# Patient Record
Sex: Male | Born: 1995 | Race: White | Hispanic: No | Marital: Single | Smoking: Never smoker
Health system: Southern US, Community
[De-identification: ages and names within clinical notes are randomized; demographics above are authoritative.]

## PROBLEM LIST (undated history)

## (undated) DIAGNOSIS — N35919 Unspecified urethral stricture, male, unspecified site: Secondary | ICD-10-CM

## (undated) DIAGNOSIS — D229 Melanocytic nevi, unspecified: Secondary | ICD-10-CM

## (undated) DIAGNOSIS — S52509A Unspecified fracture of the lower end of unspecified radius, initial encounter for closed fracture: Secondary | ICD-10-CM

## (undated) DIAGNOSIS — N3090 Cystitis, unspecified without hematuria: Secondary | ICD-10-CM

## (undated) DIAGNOSIS — N390 Urinary tract infection, site not specified: Secondary | ICD-10-CM

## (undated) DIAGNOSIS — T7840XA Allergy, unspecified, initial encounter: Secondary | ICD-10-CM

## (undated) DIAGNOSIS — Q760 Spina bifida occulta: Secondary | ICD-10-CM

## (undated) HISTORY — DX: Spina bifida occulta: Q76.0

## (undated) HISTORY — DX: Urinary tract infection, site not specified: N39.0

## (undated) HISTORY — DX: Melanocytic nevi, unspecified: D22.9

## (undated) HISTORY — DX: Unspecified urethral stricture, male, unspecified site: N35.919

## (undated) HISTORY — DX: Cystitis, unspecified without hematuria: N30.90

## (undated) HISTORY — DX: Unspecified fracture of the lower end of unspecified radius, initial encounter for closed fracture: S52.509A

## (undated) HISTORY — DX: Allergy, unspecified, initial encounter: T78.40XA

---
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
## 2015-03-20 ENCOUNTER — Encounter: Payer: Self-pay | Admitting: *Deleted

## 2015-03-20 ENCOUNTER — Ambulatory Visit (INDEPENDENT_AMBULATORY_CARE_PROVIDER_SITE_OTHER): Payer: 59 | Admitting: Family Medicine

## 2015-03-20 ENCOUNTER — Encounter: Payer: Self-pay | Admitting: Family Medicine

## 2015-03-20 VITALS — BP 118/71 | HR 103 | Temp 98.9°F | Ht 68.92 in | Wt 174.0 lb

## 2015-03-20 DIAGNOSIS — J019 Acute sinusitis, unspecified: Secondary | ICD-10-CM | POA: Diagnosis not present

## 2015-03-20 DIAGNOSIS — J301 Allergic rhinitis due to pollen: Secondary | ICD-10-CM

## 2015-03-20 DIAGNOSIS — J029 Acute pharyngitis, unspecified: Secondary | ICD-10-CM | POA: Diagnosis not present

## 2015-03-20 LAB — POCT INFLUENZA A/B
Influenza A, POC: NEGATIVE
Influenza B, POC: NEGATIVE

## 2015-03-20 LAB — POCT RAPID STREP A (OFFICE): RAPID STREP A SCREEN: NEGATIVE

## 2015-03-20 MED ORDER — AMOXICILLIN-POT CLAVULANATE 875-125 MG PO TABS
1.0000 | ORAL_TABLET | Freq: Two times a day (BID) | ORAL | Status: DC
Start: 2015-03-20 — End: 2015-05-04

## 2015-03-20 NOTE — Patient Instructions (Signed)
Use nasal saline frequently through the day Use Mucinex maximum strength 1 twice daily for cough and congestion with a large glass of water and this is the plain blue and white Mucinex Use nasal steroid like Rhinocort, Nasacort, or Nasonex. Use this regularly at nighttime Take Tylenol as needed for aches pains and fever Drink plenty of fluids Take antibiotic twice daily with food

## 2015-03-20 NOTE — Addendum Note (Signed)
Addended by: Selmer Dominion on: 03/20/2015 04:02 PM   Modules accepted: Orders

## 2015-03-20 NOTE — Progress Notes (Signed)
Subjective:    Patient ID: Dean Nielsen, male    DOB: 20-Apr-1996, 19 y.o.   MRN: [id number]  HPI Patient here today for sinus, congestion and sore throat that started about 3 days ago. The patient has had headache congestion and sinus pressure and sore throat. He is also has slight cough. He did have a headache on 2 occasions. He has not seen any color to the sputum.      Patient Active Problem List   Diagnosis Date Noted  . Chronic constipation 09/18/2012   Outpatient Encounter Prescriptions as of 03/20/2015  Medication Sig  . fluticasone (FLONASE) 50 MCG/ACT nasal spray Place 2 sprays into both nostrils daily. (Patient not taking: Reported on 03/20/2015)  . [DISCONTINUED] amoxicillin (AMOXIL) 500 MG capsule Take 1 capsule (500 mg total) by mouth 3 (three) times daily.  . [DISCONTINUED] cetirizine (ZYRTEC) 10 MG tablet Take 10 mg by mouth daily.  . [DISCONTINUED] fexofenadine-pseudoephedrine (ALLEGRA-D) 60-120 MG per tablet Take 1 tablet by mouth 2 (two) times daily.   No facility-administered encounter medications on file as of 03/20/2015.      Review of Systems  Constitutional: Negative.   HENT: Positive for congestion, postnasal drip, sinus pressure and sore throat.   Eyes: Negative.   Respiratory: Positive for cough (slight ).   Cardiovascular: Negative.   Gastrointestinal: Negative.   Endocrine: Negative.   Genitourinary: Negative.   Musculoskeletal: Negative.   Skin: Negative.   Allergic/Immunologic: Negative.   Neurological: Negative.   Hematological: Negative.   Psychiatric/Behavioral: Negative.        Objective:   Physical Exam  Constitutional: He is oriented to person, place, and time. He appears well-developed and well-nourished. No distress.  HENT:  Head: Normocephalic and atraumatic.  Right Ear: External ear normal.  Left Ear: External ear normal.  Mouth/Throat: No oropharyngeal exudate.  Sinuses were not tender to palpation. The throat is  slightly red posteriorly Nasal congestion bilaterally with turbinate swelling  Eyes: Conjunctivae and EOM are normal. Pupils are equal, round, and reactive to light. Right eye exhibits no discharge. Left eye exhibits no discharge. No scleral icterus.  Neck: Normal range of motion. Neck supple. No thyromegaly present.  Small left anterior cervical adenopathy nontender  Cardiovascular: Normal rate, regular rhythm and normal heart sounds.   No murmur heard. Pulmonary/Chest: Effort normal and breath sounds normal. No respiratory distress. He has no wheezes. He has no rales. He exhibits no tenderness.  The chest is clear anteriorly and posteriorly  Abdominal: He exhibits no mass.  Musculoskeletal: Normal range of motion. He exhibits no edema.  Lymphadenopathy:    He has no cervical adenopathy.  Neurological: He is alert and oriented to person, place, and time.  Skin: Skin is warm and dry. No rash noted.  Psychiatric: He has a normal mood and affect. His behavior is normal. Judgment and thought content normal.  Nursing note and vitals reviewed.   BP 118/71 mmHg  Pulse 103  Temp(Src) 98.9 F (37.2 C) (Oral)  Ht 5' 8.92" (1.751 m)  Wt 174 lb (78.926 kg)  BMI 25.74 kg/m2       Assessment & Plan:  1. Sore throat -Take antibiotic as directed and take Tylenol as needed for aches pains and fever - POCT rapid strep A - Culture, Group A Strep  2. Acute rhinosinusitis -Use nasal saline frequently through the day  3. Non-seasonal allergic rhinitis due to pollen -Use nasal steroid as directed regularly nightly  Meds ordered this encounter  Medications  . amoxicillin-clavulanate (AUGMENTIN) 875-125 MG tablet    Sig: Take 1 tablet by mouth 2 (two) times daily.    Dispense:  20 tablet    Refill:  0   Patient Instructions  Use nasal saline frequently through the day Use Mucinex maximum strength 1 twice daily for cough and congestion with a large glass of water and this is the plain blue  and white Mucinex Use nasal steroid like Rhinocort, Nasacort, or Nasonex. Use this regularly at nighttime Take Tylenol as needed for aches pains and fever Drink plenty of fluids Take antibiotic twice daily with food   Arrie Senate MD

## 2015-03-23 LAB — CULTURE, GROUP A STREP: STREP A CULTURE: NEGATIVE

## 2015-05-04 ENCOUNTER — Ambulatory Visit (INDEPENDENT_AMBULATORY_CARE_PROVIDER_SITE_OTHER): Payer: 59 | Admitting: Family Medicine

## 2015-05-04 ENCOUNTER — Encounter: Payer: Self-pay | Admitting: Family Medicine

## 2015-05-04 VITALS — BP 122/69 | HR 79 | Temp 97.5°F | Ht 68.93 in | Wt 176.0 lb

## 2015-05-04 DIAGNOSIS — L209 Atopic dermatitis, unspecified: Secondary | ICD-10-CM

## 2015-05-04 NOTE — Progress Notes (Signed)
   Subjective:    Patient ID: Dean Nielsen, male    DOB: 01-30-1996, 19 y.o.   MRN: [id number]  HPI Patient here tonight for a rash / hives on his ankles. This was first noticed after applying a cortisone cream to the area for atopic dermatitis. He is also been using Saran wrap over this. His mom is with him says the area is less thick and may be actually improved but there appears to be some spreading dermatitis around the ankle. He is been using Tide detergent and scented fabric softener sheets.      Patient Active Problem List   Diagnosis Date Noted  . Chronic constipation 09/18/2012   Outpatient Encounter Prescriptions as of 05/04/2015  Medication Sig  . fluticasone (FLONASE) 50 MCG/ACT nasal spray Place 2 sprays into both nostrils daily.  . [DISCONTINUED] amoxicillin-clavulanate (AUGMENTIN) 875-125 MG tablet Take 1 tablet by mouth 2 (two) times daily.   No facility-administered encounter medications on file as of 05/04/2015.      Review of Systems  Constitutional: Negative.   HENT: Negative.   Eyes: Negative.   Respiratory: Negative.   Cardiovascular: Negative.   Gastrointestinal: Negative.   Endocrine: Negative.   Genitourinary: Negative.   Musculoskeletal: Negative.   Skin: Positive for rash (ankles).  Allergic/Immunologic: Negative.   Neurological: Negative.   Hematological: Negative.   Psychiatric/Behavioral: Negative.        Objective:   Physical Exam BP 122/69 mmHg  Pulse 79  Temp(Src) 97.5 F (36.4 C) (Oral)  Ht 5' 8.93" (1.751 m)  Wt 176 lb (79.833 kg)  BMI 26.04 kg/m2 There is a dry scaly slightly thickened area around the top of the right foot and ankle. There is no rubor or redness. There is some areas of slight redness for referral to the main rash. Scrapings were done and a KOH will be looked at       Assessment & Plan:  1. Atopic dermatitis -Continue to use triamcinolone cream and mix this with Eucerin or Moisturel and apply this  twice daily to the affected area -Change to a milder detergent and use scent free fabric softeners and continue to use a non-scented mild soap like Dove or [place]  Patient Instructions  Leave off the Saran wrap Continue to apply the triamcinolone cream mixed with an emollient like Eucerin or Moisturel Rewash clothing using a mild detergent like all dermatology approved and use a scent free fabric softener     Arrie Senate MD

## 2015-05-04 NOTE — Patient Instructions (Signed)
Leave off the Saran wrap Continue to apply the triamcinolone cream mixed with an emollient like Eucerin or Moisturel Rewash clothing using a mild detergent like all dermatology approved and use a scent free fabric softener

## 2015-06-19 DIAGNOSIS — M4004 Postural kyphosis, thoracic region: Secondary | ICD-10-CM | POA: Diagnosis not present

## 2015-06-19 DIAGNOSIS — S335XXA Sprain of ligaments of lumbar spine, initial encounter: Secondary | ICD-10-CM | POA: Diagnosis not present

## 2015-06-19 DIAGNOSIS — S134XXA Sprain of ligaments of cervical spine, initial encounter: Secondary | ICD-10-CM | POA: Diagnosis not present

## 2015-06-19 DIAGNOSIS — S233XXA Sprain of ligaments of thoracic spine, initial encounter: Secondary | ICD-10-CM | POA: Diagnosis not present

## 2015-07-30 ENCOUNTER — Encounter: Payer: Self-pay | Admitting: Family Medicine

## 2015-07-30 ENCOUNTER — Ambulatory Visit (INDEPENDENT_AMBULATORY_CARE_PROVIDER_SITE_OTHER): Payer: 59 | Admitting: Family Medicine

## 2015-07-30 ENCOUNTER — Ambulatory Visit (INDEPENDENT_AMBULATORY_CARE_PROVIDER_SITE_OTHER): Payer: 59

## 2015-07-30 ENCOUNTER — Other Ambulatory Visit: Payer: Self-pay | Admitting: Family Medicine

## 2015-07-30 VITALS — BP 121/58 | HR 78 | Temp 98.4°F | Ht 68.0 in | Wt 178.0 lb

## 2015-07-30 DIAGNOSIS — R52 Pain, unspecified: Secondary | ICD-10-CM

## 2015-07-30 DIAGNOSIS — S60122A Contusion of left index finger with damage to nail, initial encounter: Secondary | ICD-10-CM

## 2015-07-30 DIAGNOSIS — S6000XA Contusion of unspecified finger without damage to nail, initial encounter: Secondary | ICD-10-CM

## 2015-07-30 DIAGNOSIS — S6010XA Contusion of unspecified finger with damage to nail, initial encounter: Secondary | ICD-10-CM

## 2015-07-30 NOTE — Progress Notes (Signed)
   Subjective:    Patient ID: Dean Nielsen, male    DOB: May 15, 1996, 20 y.o.   MRN: [id number]  HPI Patient mashed left index finger.   Review of Systems  Constitutional: Negative.   HENT: Negative.   Eyes: Negative.   Respiratory: Negative.   Cardiovascular: Negative.   Gastrointestinal: Negative.   Endocrine: Negative.   Genitourinary: Negative.   Musculoskeletal:       Smashed left index finger in car   Skin: Negative.   Allergic/Immunologic: Negative.   Neurological: Negative.   Hematological: Negative.   Psychiatric/Behavioral: Negative.           Patient Active Problem List   Diagnosis Date Noted  . Chronic constipation 09/18/2012   Outpatient Encounter Prescriptions as of 07/30/2015  Medication Sig  . [DISCONTINUED] fluticasone (FLONASE) 50 MCG/ACT nasal spray Place 2 sprays into both nostrils daily.   No facility-administered encounter medications on file as of 07/30/2015.       Objective:   Physical Exam        Assessment & Plan:

## 2015-07-30 NOTE — Progress Notes (Signed)
   Subjective:    Patient ID: Dean Nielsen, male    DOB: 1995/09/19, 20 y.o.   MRN: [id number]  HPI Patient slammed his left index finger in a car door this morning and is having a lot of pain in the finger and he has a subungual hematoma and the nail of that finger   Review of Systems Painful swollen finger with blood clot beneath nail    Objective:   Physical Exam  BP 121/58 mmHg  Pulse 78  Temp(Src) 98.4 F (36.9 C) (Oral)  Ht 5\' 8"  (1.727 m)  Wt 178 lb (80.74 kg)  BMI 27.07 kg/m2   [facility] reading (PRIMARY) by  Dr.Moore-x-ray appears normal Pressure was released from beneath the nail with a hot wick and patient immediately felt better and tolerated the procedure well.                                         Assessment & Plan:  1. Finger contusion, initial encounter -The x-ray appears negative and the patient he use some ice off and on for the next 48 hours to reduce the swelling  2. Subungual hematoma of digit of hand, initial encounter -A hot PEN was used to release the pressure from beneath the nail and the patient tolerated the procedure well and felt better.   Patient Instructions  Use ice on the finger for the next 48 hours and keep gauze to sort of blood from the subungual hematoma   Arrie Senate MD

## 2015-07-30 NOTE — Patient Instructions (Signed)
Use ice on the finger for the next 48 hours and keep gauze to sort of blood from the subungual hematoma

## 2015-09-02 DIAGNOSIS — D229 Melanocytic nevi, unspecified: Secondary | ICD-10-CM

## 2015-09-02 DIAGNOSIS — L2081 Atopic neurodermatitis: Secondary | ICD-10-CM | POA: Diagnosis not present

## 2015-09-02 DIAGNOSIS — D485 Neoplasm of uncertain behavior of skin: Secondary | ICD-10-CM | POA: Diagnosis not present

## 2015-09-02 HISTORY — DX: Melanocytic nevi, unspecified: D22.9

## 2015-09-09 ENCOUNTER — Encounter (INDEPENDENT_AMBULATORY_CARE_PROVIDER_SITE_OTHER): Payer: Self-pay

## 2016-02-05 ENCOUNTER — Ambulatory Visit (INDEPENDENT_AMBULATORY_CARE_PROVIDER_SITE_OTHER): Payer: 59 | Admitting: Family Medicine

## 2016-02-05 ENCOUNTER — Encounter: Payer: Self-pay | Admitting: Family Medicine

## 2016-02-05 VITALS — BP 109/74 | HR 78 | Temp 97.9°F | Ht 68.0 in | Wt 186.0 lb

## 2016-02-05 DIAGNOSIS — N62 Hypertrophy of breast: Secondary | ICD-10-CM | POA: Diagnosis not present

## 2016-02-05 DIAGNOSIS — R0789 Other chest pain: Secondary | ICD-10-CM | POA: Diagnosis not present

## 2016-02-05 DIAGNOSIS — F419 Anxiety disorder, unspecified: Secondary | ICD-10-CM

## 2016-02-05 NOTE — Progress Notes (Signed)
   HPI  Patient presents today here with left-sided chest/underarm pain.  Patient's lines it's been going on for about 3 months, it comes and goes. At times it bothers him every day for a week and then at times it goes away for a few weeks. He's been doing similar upper body exercises for for 5 years. He denies any shortness of breath, palpitations, swelling, shortness of breath, or headaches associated with the pain. It is not exertional. He cannot identify any aggravating or alleviating factors. It comes on it lasts for over an hour and then goes away.  It's described as a throbbing left-sided underarms/left lateral chest pain. It is nonradiating.  Timing seems to occur mostly at work in the middle of the day when he is at his highest demand. 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   PMH: Smoking status noted ROS: Per HPI  Objective: BP 109/74   Pulse 78   Temp 97.9 F (36.6 C) (Oral)   Ht 5\' 8"  (1.727 m)   Wt 186 lb (84.4 kg)   BMI 28.28 kg/m  Gen: NAD, alert, cooperative with exam HEENT: NCAT CV: RRR, good S1/S2, no murmur Chest wall: small to moderate size symmetric male breast, tenderness to palpation of the lateral edge of the pec major and lateral edge of left breast. Resp: CTABL, no wheezes, non-labored Abd: SNTND, BS present, no guarding or organomegaly Ext: No edema, warm Neuro: Alert and oriented, No gross deficits  Depression screen [facility] 2/9 02/05/2016 05/04/2015 03/20/2015 10/31/2014 10/15/2014  Decreased Interest 1 0 0 0 0  Down, Depressed, Hopeless 1 0 0 0 0  PHQ - 2 Score 2 0 0 0 0  Altered sleeping 2 - - - -  Tired, decreased energy 2 - - - -  Change in appetite 1 - - - -  Feeling bad or failure about yourself  0 - - - -  Trouble concentrating  0 - - - -  Moving slowly or fidgety/restless 0 - - - -  Suicidal thoughts 0 - - - -  PHQ-9 Score 7 - - - -  Difficult doing work/chores Somewhat difficult - - - -   GAD 7 : Generalized Anxiety Score 02/05/2016  Nervous, Anxious, on Edge 1  Control/stop worrying 3  Worry too much - different things 2  Trouble relaxing 2  Restless 1  Easily annoyed or irritable 2  Afraid - awful might happen 3  Total GAD 7 Score 14  Anxiety Difficulty Somewhat difficult     Assessment and plan:  # Chest wall pain Musculoskeletal pain of the pectoralis major versus gynecomastia related pain Treat conservatively with NSAIDs scheduled 7-10 days, ice, and decreasing intensity of upper body exercises for 2 weeks. Also possibly contribute to by anxiety.   # Anxiety Possible underlying generalized anxiety disorder Discussed coping mechanisms, possibility of CBT (told about [email or web address]), and possibility of medications.  Left it open ended and let him know that we can treat more aggressively if need be.     Laroy Apple, MD [facility] 02/05/2016, 6:34 PM

## 2016-02-05 NOTE — Patient Instructions (Signed)
Great to meet you!  Lets start with aleve 1-2 pills twice daily for 10 days Try Ice 10-15 minutes 2-3 times a day when it is bothering you.   Take it easy on your upper body exercises for the next few weeks.   Come back if it is getting worse or it does not seem to be getting better in a few weeks.

## 2016-02-16 ENCOUNTER — Telehealth: Payer: Self-pay | Admitting: Family Medicine

## 2016-02-16 MED ORDER — LORAZEPAM 0.5 MG PO TABS: 0.2500 mg | ORAL_TABLET | Freq: Three times a day (TID) | ORAL | 0 refills | Status: DC | PRN

## 2016-02-16 NOTE — Telephone Encounter (Signed)
Discussed recent panic attacks with his mother.   He has nearly gone to the ED 2 times recently.    Small amount of ativan given   Will plan to start SSRI for controller  He will follow up within 1-2 weeks.   Laroy Apple, MD [facility] 02/16/2016, 12:03 PM

## 2016-03-24 ENCOUNTER — Ambulatory Visit (INDEPENDENT_AMBULATORY_CARE_PROVIDER_SITE_OTHER): Payer: 59 | Admitting: Family Medicine

## 2016-03-24 ENCOUNTER — Encounter: Payer: Self-pay | Admitting: Family Medicine

## 2016-03-24 VITALS — BP 116/73 | HR 87 | Temp 98.1°F | Ht 68.0 in | Wt 169.0 lb

## 2016-03-24 DIAGNOSIS — F419 Anxiety disorder, unspecified: Secondary | ICD-10-CM | POA: Diagnosis not present

## 2016-03-24 MED ORDER — BUSPIRONE HCL 15 MG PO TABS: 15.0000 mg | ORAL_TABLET | Freq: Two times a day (BID) | ORAL | 2 refills | Status: DC

## 2016-03-24 NOTE — Progress Notes (Signed)
   Subjective:    Patient ID: Dean Nielsen, male    DOB: Apr 15, 1996, 20 y.o.   MRN: [id number]  HPI Patient here today to discuss depression and anxiety.The patient feels like he has a lot of stress on him. He is working and is going to 2 different schools to get his requirement so he can move on to [employer] for his training in Research officer, trade union. Or technology. He denies any physical complaints other than just fatigue. He denies any chest pain shortness of breath GI symptoms or voiding symptoms. He does admit to breaking up with his girlfriend of several years couple months ago and that this has set him back quite a bit. He says he is recovering somewhat from that but has not regained his physical endurance and he thinks that that is why he lost the weight that he is lost.     Patient Active Problem List   Diagnosis Date Noted  . Chronic constipation 09/18/2012   Outpatient Encounter Prescriptions as of 03/24/2016  Medication Sig  . [DISCONTINUED] LORazepam (ATIVAN) 0.5 MG tablet Take 0.5-1 tablets (0.25-0.5 mg total) by mouth every 8 (eight) hours as needed for anxiety. (Patient not taking: Reported on 03/24/2016)   No facility-administered encounter medications on file as of 03/24/2016.       Review of Systems  Constitutional: Negative.   HENT: Negative.   Eyes: Negative.   Respiratory: Negative.   Cardiovascular: Negative.   Gastrointestinal: Negative.   Endocrine: Negative.   Genitourinary: Negative.   Musculoskeletal: Negative.   Skin: Negative.   Allergic/Immunologic: Negative.   Neurological: Negative.   Hematological: Negative.   Psychiatric/Behavioral: The patient is nervous/anxious.        Objective:   Physical Exam  Constitutional: He is oriented to person, place, and time. He appears well-developed and well-nourished. No distress.  HENT:  Head: Normocephalic and atraumatic.  Eyes: Conjunctivae and EOM are normal. Pupils are equal, round, and reactive to  light. Right eye exhibits no discharge. Left eye exhibits no discharge. No scleral icterus.  Neck: Normal range of motion.  Musculoskeletal: Normal range of motion.  Neurological: He is alert and oriented to person, place, and time.  Skin: No rash noted.  Psychiatric: He has a normal mood and affect. His behavior is normal. Judgment and thought content normal.    BP 116/73 (BP Location: Left Arm)   Pulse 87   Temp 98.1 F (36.7 C) (Oral)   Ht 5\' 8"  (1.727 m)   Wt 169 lb (76.7 kg)   BMI 25.70 kg/m        Assessment & Plan:  1. Anxiety -Take BuSpar 15 mg twice daily on a regular basis -Avoid caffeine after 3:00 in the afternoon -Increase exercise -Keep your faith and know that you're doing the right thing for your future  Patient Instructions  Call us back in 1-2 weeks and let us know how you are doing  No caffeine after 3 pm Take medication regularly twice a day Continue to exercise regularly and rebuild your endurance Drink plenty of water and stay well hydrated     Arrie Senate MD

## 2016-03-24 NOTE — Patient Instructions (Addendum)
Call us back in 1-2 weeks and let us know how you are doing  No caffeine after 3 pm Take medication regularly twice a day Continue to exercise regularly and rebuild your endurance Drink plenty of water and stay well hydrated

## 2016-04-04 ENCOUNTER — Encounter: Payer: Self-pay | Admitting: Family Medicine

## 2016-04-04 ENCOUNTER — Ambulatory Visit (INDEPENDENT_AMBULATORY_CARE_PROVIDER_SITE_OTHER): Payer: 59 | Admitting: Family Medicine

## 2016-04-04 VITALS — BP 112/74 | HR 68 | Temp 98.1°F | Ht 68.0 in | Wt 167.5 lb

## 2016-04-04 DIAGNOSIS — H8110 Benign paroxysmal vertigo, unspecified ear: Secondary | ICD-10-CM | POA: Diagnosis not present

## 2016-04-04 NOTE — Patient Instructions (Signed)
Great to see you!   Benign Positional Vertigo Vertigo is the feeling that you or your surroundings are moving when they are not. Benign positional vertigo is the most common form of vertigo. The cause of this condition is not serious (is benign). This condition is triggered by certain movements and positions (is positional). This condition can be dangerous if it occurs while you are doing something that could endanger you or others, such as driving.  CAUSES In many cases, the cause of this condition is not known. It may be caused by a disturbance in an area of the inner ear that helps your brain to sense movement and balance. This disturbance can be caused by a viral infection (labyrinthitis), head injury, or repetitive motion. RISK FACTORS This condition is more likely to develop in:  Women.  People who are 16 years of age or older. SYMPTOMS Symptoms of this condition usually happen when you move your head or your eyes in different directions. Symptoms may start suddenly, and they usually last for less than a minute. Symptoms may include:  Loss of balance and falling.  Feeling like you are spinning or moving.  Feeling like your surroundings are spinning or moving.  Nausea and vomiting.  Blurred vision.  Dizziness.  Involuntary eye movement (nystagmus). Symptoms can be mild and cause only slight annoyance, or they can be severe and interfere with daily life. Episodes of benign positional vertigo may return (recur) over time, and they may be triggered by certain movements. Symptoms may improve over time. DIAGNOSIS This condition is usually diagnosed by medical history and a physical exam of the head, neck, and ears. You may be referred to a health care provider who specializes in ear, nose, and throat (ENT) problems (otolaryngologist) or a provider who specializes in disorders of the nervous system (neurologist). You may have additional testing, including:  MRI.  A CT scan.  Eye  movement tests. Your health care provider may ask you to change positions quickly while he or she watches you for symptoms of benign positional vertigo, such as nystagmus. Eye movement may be tested with an electronystagmogram (ENG), caloric stimulation, the Dix-Hallpike test, or the roll test.  An electroencephalogram (EEG). This records electrical activity in your brain.  Hearing tests. TREATMENT Usually, your health care provider will treat this by moving your head in specific positions to adjust your inner ear back to normal. Surgery may be needed in severe cases, but this is rare. In some cases, benign positional vertigo may resolve on its own in 2-4 weeks. HOME CARE INSTRUCTIONS Safety  Move slowly.Avoid sudden body or head movements.  Avoid driving.  Avoid operating heavy machinery.  Avoid doing any tasks that would be dangerous to you or others if a vertigo episode would occur.  If you have trouble walking or keeping your balance, try using a cane for stability. If you feel dizzy or unstable, sit down right away.  Return to your normal activities as told by your health care provider. Ask your health care provider what activities are safe for you. General Instructions  Take over-the-counter and prescription medicines only as told by your health care provider.  Avoid certain positions or movements as told by your health care provider.  Drink enough fluid to keep your urine clear or pale yellow.  Keep all follow-up visits as told by your health care provider. This is important. SEEK MEDICAL CARE IF:  You have a fever.  Your condition gets worse or you develop new  symptoms.  Your family or friends notice any behavioral changes.  Your nausea or vomiting gets worse.  You have numbness or a "pins and needles" sensation. SEEK IMMEDIATE MEDICAL CARE IF:  You have difficulty speaking or moving.  You are always dizzy.  You faint.  You develop severe headaches.  You  have weakness in your legs or arms.  You have changes in your hearing or vision.  You develop a stiff neck.  You develop sensitivity to light.   This information is not intended to replace advice given to you by your health care provider. Make sure you discuss any questions you have with your health care provider.   Document Released: 02/28/2006 Document Revised: 02/11/2015 Document Reviewed: 09/15/2014 Elsevier Interactive Patient Education [employer].

## 2016-04-04 NOTE — Progress Notes (Signed)
   HPI  Patient presents today with left ear fullness and dizziness.  Patient states over the weekend he was working under a car when he had severe symptoms of room spinning sensation. He states he continued for a few minutes and associated severe he had to hold onto the car to sit up right. It subsided and has largely gone away since that time.  Today he feels uneasy on his feet but has not had any more significant symptoms.  He's had mild ear fullness on the left but no significant ear pain. He otherwise feels well denies shortness of breath, chest pain, or difficulty tolerating foods or fluids  PMH: Smoking status noted ROS: Per HPI  Objective: BP 112/74   Pulse 68   Temp 98.1 F (36.7 C) (Oral)   Ht 5\' 8"  (1.727 m)   Wt 167 lb 8 oz (76 kg)   BMI 25.47 kg/m  Gen: NAD, alert, cooperative with exam HEENT: NCAT, TMs normal bilaterally CV: RRR, good S1/S2, no murmur Resp: CTABL, no wheezes, non-labored Ext: No edema, warm Neuro: Alert and oriented, No gross deficits  Assessment and plan:  # Benign paroxysmal positional vertigo Symptoms resolving, reassurance provided Discussed Epley's maneuver, reviewed YouTube video and given a handout. Discussed the utility of meclizine, he will defer for now. Return to clinic as needed No fluid seen in the ear, doubt any otitis media.   Laroy Apple, MD [facility] 04/04/2016, 7:18 PM

## 2016-04-26 ENCOUNTER — Ambulatory Visit (INDEPENDENT_AMBULATORY_CARE_PROVIDER_SITE_OTHER): Payer: 59 | Admitting: Licensed Clinical Social Worker

## 2016-04-26 ENCOUNTER — Ambulatory Visit: Payer: 59 | Admitting: Licensed Clinical Social Worker

## 2016-04-26 DIAGNOSIS — F321 Major depressive disorder, single episode, moderate: Secondary | ICD-10-CM

## 2016-05-24 ENCOUNTER — Ambulatory Visit (INDEPENDENT_AMBULATORY_CARE_PROVIDER_SITE_OTHER): Payer: 59 | Admitting: Licensed Clinical Social Worker

## 2016-05-24 DIAGNOSIS — F324 Major depressive disorder, single episode, in partial remission: Secondary | ICD-10-CM | POA: Diagnosis not present

## 2016-06-07 ENCOUNTER — Ambulatory Visit (INDEPENDENT_AMBULATORY_CARE_PROVIDER_SITE_OTHER): Payer: 59 | Admitting: Licensed Clinical Social Worker

## 2016-06-07 DIAGNOSIS — F324 Major depressive disorder, single episode, in partial remission: Secondary | ICD-10-CM

## 2016-09-26 ENCOUNTER — Ambulatory Visit (INDEPENDENT_AMBULATORY_CARE_PROVIDER_SITE_OTHER): Payer: 59 | Admitting: Family Medicine

## 2016-09-26 ENCOUNTER — Encounter: Payer: Self-pay | Admitting: Family Medicine

## 2016-09-26 VITALS — BP 110/72 | HR 82 | Temp 98.6°F | Ht 68.0 in | Wt 169.0 lb

## 2016-09-26 DIAGNOSIS — J029 Acute pharyngitis, unspecified: Secondary | ICD-10-CM | POA: Diagnosis not present

## 2016-09-26 DIAGNOSIS — R300 Dysuria: Secondary | ICD-10-CM

## 2016-09-26 LAB — CULTURE, GROUP A STREP

## 2016-09-26 LAB — URINALYSIS, COMPLETE
Bilirubin, UA: NEGATIVE
Glucose, UA: NEGATIVE
Ketones, UA: NEGATIVE
LEUKOCYTES UA: NEGATIVE
Nitrite, UA: NEGATIVE
PH UA: 8.5 — AB (ref 5.0–7.5)
PROTEIN UA: NEGATIVE
RBC, UA: NEGATIVE
Specific Gravity, UA: 1.015 (ref 1.005–1.030)
Urobilinogen, Ur: 0.2 mg/dL (ref 0.2–1.0)

## 2016-09-26 LAB — RAPID STREP SCREEN (MED CTR MEBANE ONLY): STREP GP A AG, IA W/REFLEX: NEGATIVE

## 2016-09-26 LAB — MICROSCOPIC EXAMINATION
BACTERIA UA: NONE SEEN
RENAL EPITHEL UA: NONE SEEN /HPF

## 2016-09-26 LAB — VERITOR FLU A/B WAIVED
Influenza A: NEGATIVE
Influenza B: NEGATIVE

## 2016-09-26 MED ORDER — DOXYCYCLINE HYCLATE 100 MG PO TABS: 100.0000 mg | ORAL_TABLET | Freq: Two times a day (BID) | ORAL | 0 refills | Status: DC

## 2016-09-26 MED ORDER — LORATADINE 10 MG PO TABS: 10.0000 mg | ORAL_TABLET | Freq: Every day | ORAL | 11 refills | Status: DC

## 2016-09-26 NOTE — Progress Notes (Signed)
BP 110/72   Pulse 82   Temp 98.6 F (37 C) (Oral)   Ht 5\' 8"  (1.727 m)   Wt 169 lb (76.7 kg)   BMI 25.70 kg/m    Subjective:    Patient ID: Dean Nielsen, male    DOB: 1995-07-16, 21 y.o.   MRN: [id number]  HPI: Dean Nielsen is a 21 y.o. male presenting on 09/26/2016 for Sore Throat (began last night; using Chloraseptic spray); Cough; and Pain in right shoulder into shoulder blade (began a few weeks ago after working out in gym)   Sore Throat   This is a new problem. The current episode started yesterday. The problem has been unchanged. Neither side of throat is experiencing more pain than the other. There has been no fever. The pain is moderate. Associated symptoms include coughing, shortness of breath and trouble swallowing. Pertinent negatives include no abdominal pain, congestion, diarrhea, ear pain, headaches, neck pain, stridor or vomiting. Treatments tried: chloroseptic spray. The treatment provided mild (Patient states his roommate has had similar symptoms starting yesterday.) relief.  Cough  This is a new problem. The current episode started today. The problem has been unchanged. The cough is non-productive. Associated symptoms include myalgias, a sore throat and shortness of breath. Pertinent negatives include no chills, ear pain, fever, headaches, rash or wheezing. Associated symptoms comments: Became short of breath while eating breakfast today. Feels fatigued today. He has tried nothing for the symptoms. His past medical history is significant for environmental allergies.  Dark Cloudy Urine Patient states he has been having dark cloudy urine in the morning for the past few days. Patient states that urine has a mild smell and believes that bacteria is growing in his urine. Patient has a history of UTIs and bladder surgery in the past. Patient states that his urine becomes clearer throughout the day with drinking water and is worse in the morning. Patient denies pain  and blood in urine.  Relevant past medical, surgical, family and social history reviewed and updated as indicated. Interim medical history since our last visit reviewed. Allergies and medications reviewed and updated.  Review of Systems  Constitutional: Positive for fatigue. Negative for chills and fever.  HENT: Positive for sore throat and trouble swallowing. Negative for congestion, ear pain and sinus pressure.   Eyes: Negative.   Respiratory: Positive for cough and shortness of breath. Negative for choking, chest tightness, wheezing and stridor.   Cardiovascular: Negative.   Gastrointestinal: Negative for abdominal pain, constipation, diarrhea, nausea and vomiting.  Genitourinary: Negative for difficulty urinating, flank pain, frequency and urgency.       Dark Cloudy Urine  Musculoskeletal: Positive for myalgias. Negative for arthralgias, neck pain and neck stiffness.  Skin: Negative for rash.  Allergic/Immunologic: Positive for environmental allergies.  Neurological: Negative for dizziness, weakness, numbness and headaches.    Per HPI unless specifically indicated above     Objective:    BP 110/72   Pulse 82   Temp 98.6 F (37 C) (Oral)   Ht 5\' 8"  (1.727 m)   Wt 169 lb (76.7 kg)   BMI 25.70 kg/m   Wt Readings from Last 3 Encounters:  09/26/16 169 lb (76.7 kg)  04/04/16 167 lb 8 oz (76 kg)  03/24/16 169 lb (76.7 kg)    Physical Exam  Constitutional: He is oriented to person, place, and time. He appears well-developed and well-nourished. No distress.  HENT:  Head: Normocephalic and atraumatic.  Right Ear: External ear normal.  Left Ear: External ear normal.  Mouth/Throat: Posterior oropharyngeal erythema present. No oropharyngeal exudate.    Eyes: Conjunctivae and EOM are normal. Pupils are equal, round, and reactive to light. Right eye exhibits no discharge. Left eye exhibits no discharge.  Neck: Normal range of motion. Neck supple.  Bilateral anterior cervical  adenopathy  Cardiovascular: Normal rate, regular rhythm, normal heart sounds and intact distal pulses.  Exam reveals no gallop and no friction rub.   No murmur heard. Pulmonary/Chest: Effort normal and breath sounds normal. No respiratory distress. He has no wheezes. He has no rales.  Abdominal: Soft. There is no tenderness.  Musculoskeletal: Normal range of motion.  Lymphadenopathy:    He has cervical adenopathy.  Neurological: He is alert and oriented to person, place, and time.  Skin: Skin is warm and dry.  Psychiatric: He has a normal mood and affect. His behavior is normal.    Rapid strep and rapid flu, negative  Urinalysis: 0-5 wbc's, 0-2 RBCs, 0-10 epithelial cells, crystals present, amorphous.    Assessment & Plan:   Problem List Items Addressed This Visit      Other   Sore throat - Primary   Relevant Medications   doxycycline (VIBRA-TABS) 100 MG tablet   loratadine (CLARITIN) 10 MG tablet   Other Relevant Orders   Veritor Flu A/B Waived   Rapid strep screen (not at [facility])   Culture, Group A Strep    Other Visit Diagnoses    Dysuria       Relevant Medications   doxycycline (VIBRA-TABS) 100 MG tablet   Other Relevant Orders   Urinalysis, Complete   Microalbumin / creatinine urine ratio     Patient seen and examined with Nuala Alpha a student. Agree with assessment and plan above. Recommended for patient to see if symptoms cleared up over the next couple days but they did not I doxycycline and old left forearm in case he needs to pick up.   Follow up plan: Return if symptoms worsen or fail to improve.  Counseling provided for all of the vaccine components Orders Placed This Encounter  Procedures  . Veritor Flu A/B Waived  . Rapid strep screen (not at [facility])  . Culture, Group A Strep

## 2016-09-27 LAB — MICROALBUMIN / CREATININE URINE RATIO
Creatinine, Urine: 86.1 mg/dL
Microalb/Creat Ratio: <3.5 mg/g creat (ref 0.0–30.0)
Microalbumin, Urine: <3 ug/mL

## 2016-09-30 LAB — CULTURE, GROUP A STREP

## 2016-10-18 ENCOUNTER — Other Ambulatory Visit: Payer: Self-pay | Admitting: Family

## 2016-10-18 MED ORDER — AMOXICILLIN-POT CLAVULANATE 875-125 MG PO TABS: 1.0000 | ORAL_TABLET | Freq: Two times a day (BID) | ORAL | 0 refills | Status: DC

## 2016-10-18 NOTE — Progress Notes (Signed)
Augmentin Prescription sent to pharmacy,

## 2016-10-24 ENCOUNTER — Ambulatory Visit (INDEPENDENT_AMBULATORY_CARE_PROVIDER_SITE_OTHER): Payer: 59 | Admitting: Pediatrics

## 2016-10-24 ENCOUNTER — Encounter: Payer: Self-pay | Admitting: Pediatrics

## 2016-10-24 VITALS — BP 97/58 | HR 83 | Temp 97.9°F | Ht 68.0 in | Wt 179.0 lb

## 2016-10-24 DIAGNOSIS — Z85820 Personal history of malignant melanoma of skin: Secondary | ICD-10-CM | POA: Diagnosis not present

## 2016-10-24 DIAGNOSIS — G8929 Other chronic pain: Secondary | ICD-10-CM

## 2016-10-24 DIAGNOSIS — R591 Generalized enlarged lymph nodes: Secondary | ICD-10-CM | POA: Diagnosis not present

## 2016-10-24 DIAGNOSIS — Z9889 Other specified postprocedural states: Secondary | ICD-10-CM | POA: Diagnosis not present

## 2016-10-24 DIAGNOSIS — M25511 Pain in right shoulder: Secondary | ICD-10-CM | POA: Diagnosis not present

## 2016-10-24 NOTE — Progress Notes (Signed)
  Subjective:   Patient ID: Dean Nielsen, male    DOB: 03/06/1996, 21 y.o.   MRN: [id number] CC: knot in neck (right side larger than left ) and right shoulder pain (worse (mentioned in the past))  HPI: Dean Nielsen is a 21 y.o. male presenting for knot in neck (right side larger than left ) and right shoulder pain (worse (mentioned in the past))  Noticed the nodes in his neck a few days ago No pain, not bothering him. Not growing or changing, not tender or red No other nodes/lumps that he has noticed No sore throat, no ear pain, no fevers Normal appetite Energy levels have been fine Feeling well  H/o melanoma, was excised.  Follows with dermatology q6 mo now  R shoulder has been bothering him for past 3 mo Started after an injury, lifting weight from behind and up over his head Didn't feel anything pop, but R shoulder hurt following the incident Stopped weight lifting for a month Has been taking NSAIDs Continues to be bothered with the same movement No weakness, no numbness, tingling in arm Never injured this shoulder before Points to top of shoulder, back to shoulder blade when asked where pain is  Relevant past medical, surgical, family and social history reviewed. Allergies and medications reviewed and updated. History  Smoking Status  . Never Smoker  Smokeless Tobacco  . Never Used   ROS: Per HPI   Objective:    BP (!) 97/58 (BP Location: Left Arm)   Pulse 83   Temp 97.9 F (36.6 C) (Oral)   Ht 5\' 8"  (1.727 m)   Wt 179 lb (81.2 kg)   BMI 27.22 kg/m   Wt Readings from Last 3 Encounters:  10/24/16 179 lb (81.2 kg)  09/26/16 169 lb (76.7 kg)  04/04/16 167 lb 8 oz (76 kg)    Gen: NAD, alert, cooperative with exam, NCAT EYES: EOMI, no conjunctival injection, or no icterus ENT:  TMs pink, injected b/l with clear effusion, OP with mild erythema LYMPH: + <1cm cervical LAD b/l, easily mobile, no tenderness CV: NRRR, normal S1/S2, no murmur, distal  pulses 2+ b/l Resp: CTABL, no wheezes, normal WOB Ext: No edema, warm Neuro: Alert and oriented, sensation intact b/l UE MSK: shoulders appear symmetric Shoulder: Inspection reveals no abnormalities, atrophy or asymmetry. no tenderness over AC joint or bicipital groove, some ttp over soft tissue over trapezius ROM is full b/l Pain/weakness with neers R side Rotator cuff strength otherwise normal  Assessment & Plan:  Dean Nielsen was seen today for knot in neck and right shoulder pain.  Diagnoses and all orders for this visit:  Lymphadenopathy Likely due to virus given other exam findings Should improve over next week Will check below Any worsening let me know -     CBC with Differential/Platelet -     Basic Metabolic Panel  Chronic right shoulder pain -     Ambulatory referral to Sports Medicine  H/O melanoma excision Follows regularly with dermatology  Follow up plan: prn Assunta Found, MD [place]

## 2016-10-25 DIAGNOSIS — Z9889 Other specified postprocedural states: Secondary | ICD-10-CM | POA: Insufficient documentation

## 2016-10-25 DIAGNOSIS — Z85820 Personal history of malignant melanoma of skin: Secondary | ICD-10-CM | POA: Insufficient documentation

## 2016-10-26 LAB — BASIC METABOLIC PANEL
BUN/Creatinine Ratio: 13 (ref 9–20)
BUN: 17 mg/dL (ref 6–20)
CALCIUM: 9.5 mg/dL (ref 8.7–10.2)
CHLORIDE: 100 mmol/L (ref 96–106)
CO2: 28 mmol/L (ref 18–29)
Creatinine, Ser: 1.32 mg/dL — ABNORMAL HIGH (ref 0.76–1.27)
GFR calc Af Amer: 89 mL/min/{1.73_m2} (ref >59)
GFR calc non Af Amer: 77 mL/min/{1.73_m2} (ref >59)
GLUCOSE: 80 mg/dL (ref 65–99)
POTASSIUM: 4.4 mmol/L (ref 3.5–5.2)
SODIUM: 142 mmol/L (ref 134–144)

## 2016-10-26 LAB — CBC WITH DIFFERENTIAL/PLATELET
BASOS ABS: 0 10*3/uL (ref 0.0–0.2)
Basos: 0 %
EOS (ABSOLUTE): 0.2 10*3/uL (ref 0.0–0.4)
Eos: 3 %
HEMOGLOBIN: 14.9 g/dL (ref 13.0–17.7)
Hematocrit: 45.4 % (ref 37.5–51.0)
IMMATURE GRANS (ABS): 0 10*3/uL (ref 0.0–0.1)
Immature Granulocytes: 0 %
LYMPHS: 48 %
Lymphocytes Absolute: 3.2 10*3/uL — ABNORMAL HIGH (ref 0.7–3.1)
MCH: 30.4 pg (ref 26.6–33.0)
MCHC: 32.8 g/dL (ref 31.5–35.7)
MCV: 93 fL (ref 79–97)
MONOCYTES: 5 %
Monocytes Absolute: 0.4 10*3/uL (ref 0.1–0.9)
NEUTROS ABS: 3.1 10*3/uL (ref 1.4–7.0)
Neutrophils: 44 %
PLATELETS: 265 10*3/uL (ref 150–379)
RBC: 4.9 x10E6/uL (ref 4.14–5.80)
RDW: 13 % (ref 12.3–15.4)
WBC: 6.9 10*3/uL (ref 3.4–10.8)

## 2016-11-04 ENCOUNTER — Encounter: Payer: Self-pay | Admitting: Family Medicine

## 2016-11-04 ENCOUNTER — Telehealth: Payer: Self-pay | Admitting: *Deleted

## 2016-11-04 ENCOUNTER — Ambulatory Visit (INDEPENDENT_AMBULATORY_CARE_PROVIDER_SITE_OTHER): Payer: 59 | Admitting: Family Medicine

## 2016-11-04 VITALS — BP 120/70 | Ht 70.0 in | Wt 173.0 lb

## 2016-11-04 DIAGNOSIS — N62 Hypertrophy of breast: Secondary | ICD-10-CM

## 2016-11-04 DIAGNOSIS — M25511 Pain in right shoulder: Secondary | ICD-10-CM | POA: Diagnosis not present

## 2016-11-04 DIAGNOSIS — G2589 Other specified extrapyramidal and movement disorders: Secondary | ICD-10-CM

## 2016-11-04 NOTE — Telephone Encounter (Signed)
FYI

## 2016-11-04 NOTE — Progress Notes (Signed)
   Subjective:    Patient ID: Dean Nielsen, male    DOB: 1995/11/07, 21 y.o.   MRN: [id number]  HPI Dean Nielsen is a 21yo male presenting today for right shoulder pain. Notes 69months ago he was lifting weights up from behind his head in a triceps extension when he noted his right shoulder becoming "unbearably uncomfortable." Denies any popping or noting acute onset of pain, just became gradually more uncomfortable. Since then, pain has spread to his right chest and right shoulder blade. Pain is worse in the morning and it is difficult for him to move his shoulder when he first gets up. During the day, pain does not necessarily limit activity. Has not lifted weights for 6weeks. Has been using Ibuprofen and Fish Oil without relief. Works in Engineer, technical sales. Has history of gynecomastia and melanoma. Right hand dominant. Nonsmoker.  Review of Systems No numbness or tingling in his fingers. Does not feel like he's lost strength in his upper extremities. He denies unusual weight change. No fevers, sweats, chills. No lower extremity symptoms of paresthesias or weakness.  PERTINENT  PMH / PSH: I have reviewed the patient's medications, allergies, past medical and surgical history, smoking status.  Pertinent findings that relate to today's visit / issues include: No personal history diabetes mellitus No history of prior neck injury or neck surgery. No history of shoulder surgery. Prior history of melanoma excision Prior mention of gynecomastia in his chart    Objective:   Physical Exam  Constitutional: He appears well-developed and well-nourished. No distress.  Pulmonary/Chest: Effort normal. No respiratory distress.  Musculoskeletal:  Asymmetry noted over posterior shoulders, please see pictures below. ROM symmetric in shoulders bilaterally. Tenderness over right infraspinatus. Mild pain with right empty can. Rotator cuff intact. Muscle strength 5/5 in upper and lower extremities.    Psychiatric: He has a  normal mood and affect. His behavior is normal.      Assessment & Plan:

## 2016-11-04 NOTE — Telephone Encounter (Signed)
Mother here at the office.  Mother states that patient seen sport's medicine today 11/04/16 and was informed that he would need a referral from PCP to have Gynecomastia evaluated

## 2016-11-04 NOTE — Telephone Encounter (Signed)
Talked with Mom & patient on phone Appt scheduled

## 2016-11-04 NOTE — Progress Notes (Signed)
[place] Attending Note: I have seen and examined this patient. I have discussed this patient with the resident and reviewed the assessment and plan as documented above. I agree with the resident's findings and plan. Scapular dyskinesia. This may have started with cervical injury. He appears to have pain and some mild atrophy of the right infraspinatus. Either the left scapula is weighing or the right scapula is unusually flat, either way they are asymmetrical representing a dyskinesis. 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   I think at this point the best route would be to try physical therapy to regain what he is lost. This is been going on many months. If he is not regaining full function, he will follow-up with [place].  #2. Gynecomastia: He's had this diagnosis and is discussed this previously with a prior PCP. Did mention to him again today. He has fairly significant gynecomastia and would likely benefit from surgical evaluation to remove this excess tissue as it does place him at increased risk of breast cancer. Given his history of melanoma, I think it would be even more aggressive in pursuit of excision abnormal tissue. Recommended discussed this with his PCP.

## 2016-11-04 NOTE — Telephone Encounter (Signed)
Will recommend follow up to discus.   Likely refer to General surgery but will plan to discuss.   Laroy Apple, MD [facility] 11/04/2016, 5:14 PM

## 2016-11-21 ENCOUNTER — Ambulatory Visit (INDEPENDENT_AMBULATORY_CARE_PROVIDER_SITE_OTHER): Payer: 59 | Admitting: Family Medicine

## 2016-11-21 ENCOUNTER — Encounter: Payer: Self-pay | Admitting: Family Medicine

## 2016-11-21 VITALS — BP 122/71 | HR 70 | Temp 97.5°F | Ht 70.0 in | Wt 176.4 lb

## 2016-11-21 DIAGNOSIS — N62 Hypertrophy of breast: Secondary | ICD-10-CM

## 2016-11-21 NOTE — Progress Notes (Addendum)
HPI  Patient presents today here to follow-up for gynecomastia.  Patient states that he's had breast tissue since her 72 or 21 years old, it's persistently gotten worse throughout his life. He has lost a significant amount of weight and the breast tissue has not resolved. He works out doing upper body exercises routinely to try and resolve the issue.  Has had some tenderness of the right breast and has tenderness on our exam tonight. He denies any nipple discharge, inversion, or skin changes.  He feels that his sex drive is actually probably slightly lower than other guys his age. He does not have a problem with erectile dysfunction.  PMH: Smoking status noted ROS: Per HPI  Objective: BP 122/71   Pulse 70   Temp 97.5 F (36.4 C) (Oral)   Ht _0  (1.778 m)   Wt 176 lb 6.4 oz (80 kg)   BMI 25.31 kg/m  Gen: NAD, alert, cooperative with exam HEENT: NCAT CV: RRR, good S1/S2, no murmur Resp: CTABL, no wheezes, non-labored Ext: No edema, warm Neuro: Alert and oriented, No gross deficits Chest wall/breast:  Approximately 5 cm x 3 cm area of breast tissue under bilateral arerola, these are asymmetrically distributed behind the aerola. He has mild tenderness on the right, no discrete masses are observed. No lymph nodes palpated in the axilla bilaterally  Assessment and plan:  # Gynecomastia Asymmetric distribution of breast tissue behind bilateral areola.  He has some persistent tenderness in the right breast for several months. Patient sex drive is slightly down, we will check testosterone to rule out hypogonadism. (checking between 8 and 10 am) Also check CMP to rule out any liver disease Ultrasound to rule out any mass given asymmetric distribution behind aerola Refer to general surgery to consider mastectomy     Orders Placed This Encounter  Procedures  . US BREAST COMPLETE UNI LEFT INC AXILLA    Standing Status:   Future    Standing Expiration Date:   01/21/2018   Order Specific Question:   Reason for Exam (SYMPTOM  OR DIAGNOSIS REQUIRED)    Answer:   gynecomastia assymetric BL    Order Specific Question:   Preferred imaging location?    Answer:   [facility]  . US BREAST COMPLETE UNI RIGHT INC AXILLA    Standing Status:   Future    Standing Expiration Date:   01/21/2018    Order Specific Question:   Reason for Exam (SYMPTOM  OR DIAGNOSIS REQUIRED)    Answer:   gynecomastia assymetric BL    Order Specific Question:   Preferred imaging location?    Answer:   [facility]  . Testosterone    Standing Status:   Future    Standing Expiration Date:   11/21/2017  . CMP14+EGFR    Standing Status:   Future    Standing Expiration Date:   11/21/2017  . TSH    Standing Status:   Future    Standing Expiration Date:   11/21/2017  . Ambulatory referral to General Surgery    Referral Priority:   Routine    Referral Type:   Surgical    Referral Reason:   Specialty Services Required    Requested Specialty:   General Surgery    Number of Visits Requested:   [place], MD [facility] 11/21/2016, 6:40 PM  After further consideration- considering his tenderness I will add additional hormone studies for upcoming labs.  Laroy Apple, MD [facility] 11/22/2016, 7:59 AM

## 2016-11-21 NOTE — Patient Instructions (Signed)
Great to see you!  Lets get your labs fasting this Saturday between 8 and 10 am.   We will work on getting an ultrasound and a referral to general surgery arranged.

## 2016-11-22 NOTE — Addendum Note (Signed)
Addended by: Timmothy Euler on: 11/22/2016 08:02 AM   Modules accepted: Orders

## 2016-11-25 NOTE — Addendum Note (Signed)
Addended by: Liliane Bade on: 11/25/2016 08:30 AM   Modules accepted: Orders

## 2016-11-26 LAB — CMP14+EGFR
ALT: 11 IU/L (ref 0–44)
AST: 20 IU/L (ref 0–40)
Albumin/Globulin Ratio: 1.8 (ref 1.2–2.2)
Albumin: 4.7 g/dL (ref 3.5–5.5)
Alkaline Phosphatase: 71 IU/L (ref 39–117)
BUN/Creatinine Ratio: 12 (ref 9–20)
BUN: 14 mg/dL (ref 6–20)
Bilirubin Total: 0.7 mg/dL (ref 0.0–1.2)
CO2: 25 mmol/L (ref 20–29)
CREATININE: 1.14 mg/dL (ref 0.76–1.27)
Calcium: 9.9 mg/dL (ref 8.7–10.2)
Chloride: 100 mmol/L (ref 96–106)
GFR calc non Af Amer: 92 mL/min/{1.73_m2} (ref >59)
GFR, EST AFRICAN AMERICAN: 106 mL/min/{1.73_m2} (ref >59)
GLOBULIN, TOTAL: 2.6 g/dL (ref 1.5–4.5)
Glucose: 90 mg/dL (ref 65–99)
Potassium: 4.1 mmol/L (ref 3.5–5.2)
SODIUM: 140 mmol/L (ref 134–144)
TOTAL PROTEIN: 7.3 g/dL (ref 6.0–8.5)

## 2016-11-26 LAB — LUTEINIZING HORMONE: LH: 4.6 m[IU]/mL (ref 1.7–8.6)

## 2016-11-26 LAB — TESTOSTERONE: Testosterone: 397 ng/dL (ref 264–916)

## 2016-11-26 LAB — BETA HCG QUANT (REF LAB): hCG Quant: <1 m[IU]/mL (ref 0–3)

## 2016-11-26 LAB — ESTRADIOL: ESTRADIOL: 30.2 pg/mL (ref 7.6–42.6)

## 2016-11-26 LAB — TSH: TSH: 2.05 u[IU]/mL (ref 0.450–4.500)

## 2016-12-06 ENCOUNTER — Ambulatory Visit (HOSPITAL_COMMUNITY)
Admission: RE | Admit: 2016-12-06 | Discharge: 2016-12-06 | Disposition: A | Discharge status: Home / Self Care | Payer: 59 | Source: Ambulatory Visit | Attending: Family Medicine | Admitting: Family Medicine

## 2016-12-06 DIAGNOSIS — N62 Hypertrophy of breast: Secondary | ICD-10-CM | POA: Insufficient documentation

## 2016-12-21 ENCOUNTER — Encounter: Payer: Self-pay | Admitting: Nurse Practitioner

## 2016-12-21 ENCOUNTER — Ambulatory Visit (INDEPENDENT_AMBULATORY_CARE_PROVIDER_SITE_OTHER): Payer: 59 | Admitting: Nurse Practitioner

## 2016-12-21 VITALS — BP 121/71 | HR 66 | Temp 97.8°F | Ht 70.0 in | Wt 175.8 lb

## 2016-12-21 DIAGNOSIS — N492 Inflammatory disorders of scrotum: Secondary | ICD-10-CM | POA: Diagnosis not present

## 2016-12-21 NOTE — Progress Notes (Signed)
   Subjective:    Patient ID: Dean Nielsen, male    DOB: 04-Jan-1996, 21 y.o.   MRN: [id number]  HPI Patient comes in today c/o testicular nodule. He first found it 2 years ago and was sent for an Ultra sound. Which showed a cyst. They told him to just watch it and if it got bigger to let someone recheck it. He has noticed nodularity again and it is now sore to touch. @ years ago it was the size of a " pin head " and now it feels about the size of a "BB"    Review of Systems  Constitutional: Negative.   Respiratory: Negative.   Cardiovascular: Negative.   Genitourinary: Negative.   Neurological: Negative.   Psychiatric/Behavioral: Negative.   All other systems reviewed and are negative.      Objective:   Physical Exam  Constitutional: He is oriented to person, place, and time. He appears well-developed and well-nourished.  Cardiovascular: Normal rate and regular rhythm.   Pulmonary/Chest: Effort normal and breath sounds normal.  Genitourinary: Penis normal.  Genitourinary Comments: Small "BB" size tender nodule in scrotal sac just at base of penis on right side.  There is no testicular nodules or pain  Neurological: He is alert and oriented to person, place, and time.  Skin: Skin is warm.  Psychiatric: He has a normal mood and affect. His behavior is normal. Judgment and thought content normal.   BP 121/71   Pulse 66   Temp 97.8 F (36.6 C) (Oral)   Ht 5\' 10"  (1.778 m)   Wt 175 lb 12.8 oz (79.7 kg)   BMI 25.22 kg/m        Assessment & Plan:  1. Scrotal nodule Continue to watch Will call with U/S results once done  Mary-Margaret Hassell Done, FNP  - US Scrotum; Future

## 2016-12-22 ENCOUNTER — Other Ambulatory Visit: Payer: Self-pay | Admitting: Nurse Practitioner

## 2016-12-22 DIAGNOSIS — N5089 Other specified disorders of the male genital organs: Secondary | ICD-10-CM

## 2016-12-26 ENCOUNTER — Ambulatory Visit: Payer: 59

## 2016-12-26 ENCOUNTER — Ambulatory Visit (HOSPITAL_COMMUNITY): Admission: RE | Admit: 2016-12-26 | Payer: 59 | Source: Ambulatory Visit

## 2016-12-27 ENCOUNTER — Ambulatory Visit (HOSPITAL_COMMUNITY)
Admission: RE | Admit: 2016-12-27 | Discharge: 2016-12-27 | Disposition: A | Discharge status: Home / Self Care | Payer: 59 | Source: Ambulatory Visit | Attending: Nurse Practitioner | Admitting: Nurse Practitioner

## 2016-12-27 DIAGNOSIS — N5089 Other specified disorders of the male genital organs: Secondary | ICD-10-CM

## 2016-12-27 DIAGNOSIS — N492 Inflammatory disorders of scrotum: Secondary | ICD-10-CM

## 2016-12-27 DIAGNOSIS — N503 Cyst of epididymis: Secondary | ICD-10-CM | POA: Diagnosis not present

## 2016-12-27 DIAGNOSIS — N509 Disorder of male genital organs, unspecified: Secondary | ICD-10-CM | POA: Diagnosis present

## 2017-04-15 ENCOUNTER — Encounter: Payer: Self-pay | Admitting: Family Medicine

## 2017-04-15 ENCOUNTER — Ambulatory Visit: Payer: 59 | Admitting: Family Medicine

## 2017-04-15 VITALS — BP 110/72 | HR 60 | Temp 97.3°F | Ht 70.0 in | Wt 175.0 lb

## 2017-04-15 DIAGNOSIS — F411 Generalized anxiety disorder: Secondary | ICD-10-CM

## 2017-04-15 NOTE — Progress Notes (Signed)
BP 110/72   Pulse 60   Temp (!) 97.3 F (36.3 C) (Oral)   Ht 5\' 10"  (1.778 m)   Wt 175 lb (79.4 kg)   BMI 25.11 kg/m    Subjective:    Patient ID: Dean Nielsen, male    DOB: [birth year], 21 y.o.   MRN: [id number]  HPI: Dean Nielsen is a 21 y.o. male presenting on 04/15/2017 for Anxiety   HPI Anxiety Patient is coming in to discuss anxiety.  He has had issues with anxiety in the past and has taken counseling for it before but has never been medicated for it.  He says over the past 2 months he has been having a lot more issues with it to where to build it up to where he is having the start of panic attacks and it is starting to affect and control his ability to accomplish his tasks at work and at home on a day-to-day basis.  He denies any major feelings of depression or feeling down.  He denies any suicidal ideation himself. Depression screen [facility] 2/9 04/15/2017 12/21/2016 11/21/2016 10/24/2016 09/26/2016  Decreased Interest 0 0 0 0 0  Down, Depressed, Hopeless 2 0 0 0 0  PHQ - 2 Score 2 0 0 0 0  Altered sleeping 3 - - - -  Tired, decreased energy 3 - - - -  Change in appetite 2 - - - -  Feeling bad or failure about yourself  0 - - - -  Trouble concentrating 2 - - - -  Moving slowly or fidgety/restless 0 - - - -  Suicidal thoughts 0 - - - -  PHQ-9 Score 12 - - - -  Difficult doing work/chores Very difficult - - - -     Relevant past medical, surgical, family and social history reviewed and updated as indicated. Interim medical history since our last visit reviewed. Allergies and medications reviewed and updated.  Review of Systems  Constitutional: Positive for fatigue. Negative for chills and fever.  Respiratory: Negative for shortness of breath and wheezing.   Cardiovascular: Negative for chest pain and leg swelling.  Musculoskeletal: Negative for back pain and gait problem.  Skin: Negative for rash.  Psychiatric/Behavioral: Positive for decreased concentration  and dysphoric mood. Negative for self-injury, sleep disturbance and suicidal ideas. The patient is nervous/anxious.   All other systems reviewed and are negative.   Per HPI unless specifically indicated above     Objective:    BP 110/72   Pulse 60   Temp (!) 97.3 F (36.3 C) (Oral)   Ht 5\' 10"  (1.778 m)   Wt 175 lb (79.4 kg)   BMI 25.11 kg/m   Wt Readings from Last 3 Encounters:  04/15/17 175 lb (79.4 kg)  12/21/16 175 lb 12.8 oz (79.7 kg)  11/21/16 176 lb 6.4 oz (80 kg)    Physical Exam  Constitutional: He is oriented to person, place, and time. He appears well-developed and well-nourished. No distress.  Eyes: Conjunctivae are normal. No scleral icterus.  Neck: Neck supple. No thyromegaly present.  Cardiovascular: Normal rate, regular rhythm, normal heart sounds and intact distal pulses.  No murmur heard. Pulmonary/Chest: Effort normal and breath sounds normal. No respiratory distress. He has no wheezes.  Musculoskeletal: Normal range of motion.  Lymphadenopathy:    He has no cervical adenopathy.  Neurological: He is alert and oriented to person, place, and time. Coordination normal.  Skin: Skin is warm and dry. No rash noted.  He is not diaphoretic.  Psychiatric: His behavior is normal. Judgment normal. His mood appears anxious. He does not exhibit a depressed mood. He expresses no suicidal ideation. He expresses no suicidal plans.  Nursing note and vitals reviewed.     Assessment & Plan:   Problem List Items Addressed This Visit    None    Visit Diagnoses    Generalized anxiety disorder    -  Primary   Relevant Orders   CBC with Differential/Platelet (Completed)   TSH (Completed)      Patient's does not want to go with medications at this point and would like to try going to counseling again, he will call them up and go see them.  Follow up plan: Return if symptoms worsen or fail to improve, for Follow-up anxiety as needed, patient will go see a  counselor.  Counseling provided for all of the vaccine components Orders Placed This Encounter  Procedures  . CBC with Differential/Platelet  . TSH    Caryl Pina, MD [facility] 04/15/2017, 12:07 PM

## 2017-04-16 LAB — CBC WITH DIFFERENTIAL/PLATELET
BASOS ABS: 0 10*3/uL (ref 0.0–0.2)
BASOS: 0 %
EOS (ABSOLUTE): 0.1 10*3/uL (ref 0.0–0.4)
Eos: 1 %
Hematocrit: 48 % (ref 37.5–51.0)
Hemoglobin: 16.1 g/dL (ref 13.0–17.7)
Immature Grans (Abs): 0 10*3/uL (ref 0.0–0.1)
Immature Granulocytes: 0 %
Lymphocytes Absolute: 2 10*3/uL (ref 0.7–3.1)
Lymphs: 28 %
MCH: 30.3 pg (ref 26.6–33.0)
MCHC: 33.5 g/dL (ref 31.5–35.7)
MCV: 90 fL (ref 79–97)
MONOS ABS: 0.5 10*3/uL (ref 0.1–0.9)
Monocytes: 7 %
NEUTROS ABS: 4.4 10*3/uL (ref 1.4–7.0)
NEUTROS PCT: 64 %
Platelets: 272 10*3/uL (ref 150–379)
RBC: 5.31 x10E6/uL (ref 4.14–5.80)
RDW: 13.3 % (ref 12.3–15.4)
WBC: 7 10*3/uL (ref 3.4–10.8)

## 2017-04-16 LAB — TSH: TSH: 0.739 u[IU]/mL (ref 0.450–4.500)

## 2017-04-18 ENCOUNTER — Telehealth: Payer: Self-pay

## 2017-04-18 MED ORDER — HYDROXYZINE HCL 10 MG PO TABS: 10.0000 mg | ORAL_TABLET | Freq: Three times a day (TID) | ORAL | 0 refills | Status: DC | PRN

## 2017-04-18 NOTE — Telephone Encounter (Signed)
Mom states that patient was supposed to get put on hydroxyzine from his apt 11/10. Do not see it in the note- please advise and send to [facility] [place] if approved.

## 2017-04-18 NOTE — Telephone Encounter (Signed)
Mom aware.

## 2017-05-08 ENCOUNTER — Encounter: Payer: Self-pay | Admitting: Family Medicine

## 2017-05-08 ENCOUNTER — Ambulatory Visit: Payer: 59 | Admitting: Family Medicine

## 2017-05-08 VITALS — BP 115/70 | HR 84 | Temp 98.2°F | Ht 70.0 in | Wt 173.4 lb

## 2017-05-08 DIAGNOSIS — F320 Major depressive disorder, single episode, mild: Secondary | ICD-10-CM | POA: Diagnosis not present

## 2017-05-08 DIAGNOSIS — Z202 Contact with and (suspected) exposure to infections with a predominantly sexual mode of transmission: Secondary | ICD-10-CM

## 2017-05-08 DIAGNOSIS — F411 Generalized anxiety disorder: Secondary | ICD-10-CM | POA: Diagnosis not present

## 2017-05-08 MED ORDER — ESCITALOPRAM OXALATE 10 MG PO TABS: 10.0000 mg | ORAL_TABLET | Freq: Every day | ORAL | 1 refills | Status: DC

## 2017-05-08 NOTE — Progress Notes (Signed)
BP 115/70   Pulse 84   Temp 98.2 F (36.8 C) (Oral)   Ht 5\' 10"  (1.778 m)   Wt 173 lb 6.4 oz (78.7 kg)   BMI 24.88 kg/m    Subjective:    Patient ID: Dean Nielsen, male    DOB: [birth year], 21 y.o.   MRN: [id number]  HPI: Dean Nielsen is a 21 y.o. male presenting on 05/08/2017 for Depression (medication not working- would like it changed)   HPI Anxiety and depression Dating depression.  He was seen for this previously and was given hydroxyzine as needed and was going to seek counseling but he says is going to take too long to get into counseling and he would like to discuss other medications.  He feels like the hydroxyzine is not working.  He denies any suicidal ideations or thoughts of hurting himself. Depression screen [facility] 2/9 05/08/2017 04/15/2017 12/21/2016 11/21/2016 10/24/2016  Decreased Interest 3 0 0 0 0  Down, Depressed, Hopeless 2 2 0 0 0  PHQ - 2 Score 5 2 0 0 0  Altered sleeping 3 3 - - -  Tired, decreased energy 1 3 - - -  Change in appetite 1 2 - - -  Feeling bad or failure about yourself  1 0 - - -  Trouble concentrating 3 2 - - -  Moving slowly or fidgety/restless 1 0 - - -  Suicidal thoughts 0 0 - - -  PHQ-9 Score 15 12 - - -  Difficult doing work/chores - Very difficult - - -    Possible STD exposure with 1 of his past partners that he did not use protection for every time.  He would like to be tested.  He denies any current symptoms or rashes or sores  Relevant past medical, surgical, family and social history reviewed and updated as indicated. Interim medical history since our last visit reviewed. Allergies and medications reviewed and updated.  Review of Systems  Constitutional: Negative for chills and fever.  Respiratory: Negative for shortness of breath and wheezing.   Cardiovascular: Negative for chest pain and leg swelling.  Musculoskeletal: Negative for back pain and gait problem.  Skin: Negative for rash.  Psychiatric/Behavioral:  Negative for decreased concentration, dysphoric mood, self-injury, sleep disturbance and suicidal ideas. The patient is nervous/anxious.   All other systems reviewed and are negative.   Per HPI unless specifically indicated above        Objective:    BP 115/70   Pulse 84   Temp 98.2 F (36.8 C) (Oral)   Ht 5\' 10"  (1.778 m)   Wt 173 lb 6.4 oz (78.7 kg)   BMI 24.88 kg/m   Wt Readings from Last 3 Encounters:  05/08/17 173 lb 6.4 oz (78.7 kg)  04/15/17 175 lb (79.4 kg)  12/21/16 175 lb 12.8 oz (79.7 kg)    Physical Exam  Constitutional: He is oriented to person, place, and time. He appears well-developed and well-nourished. No distress.  Eyes: Conjunctivae are normal. No scleral icterus.  Neck: Neck supple. No thyromegaly present.  Cardiovascular: Normal rate, regular rhythm, normal heart sounds and intact distal pulses.  No murmur heard. Pulmonary/Chest: Effort normal and breath sounds normal. No respiratory distress. He has no wheezes. He has no rales.  Musculoskeletal: Normal range of motion. He exhibits no edema.  Lymphadenopathy:    He has no cervical adenopathy.  Neurological: He is alert and oriented to person, place, and time. Coordination normal.  Skin: Skin  is warm and dry. No rash noted. He is not diaphoretic.  Psychiatric: His behavior is normal. Judgment normal. His mood appears anxious. He exhibits a depressed mood. He expresses no suicidal ideation. He expresses no suicidal plans.  Nursing note and vitals reviewed.      Assessment & Plan:   Problem List Items Addressed This Visit      Other   Depression, major, single episode, mild (HCC)   Relevant Medications   escitalopram (LEXAPRO) 10 MG tablet   Generalized anxiety disorder - Primary   Relevant Medications   escitalopram (LEXAPRO) 10 MG tablet    Other Visit Diagnoses    Possible exposure to STD       Relevant Orders   STD Screen (8) (Completed)   Chlamydia/Gonococcus/Trichomonas, NAA  (Completed)       Follow up plan: Return in about 4 weeks (around 06/05/2017), or if symptoms worsen or fail to improve, for Anxiety depression recheck.  Counseling provided for all of the vaccine components Orders Placed This Encounter  Procedures  . STD Screen (8)    Caryl Pina, MD [facility] 05/08/2017, 6:42 PM

## 2017-05-10 LAB — STD SCREEN (8)
HEP A IGM: NEGATIVE
HIV Screen 4th Generation wRfx: NONREACTIVE
HSV 1 Glycoprotein G Ab, IgG: <0.91 index (ref 0.00–0.90)
Hep B C IgM: NEGATIVE
Hep C Virus Ab: <0.1 s/co ratio (ref 0.0–0.9)
Hepatitis B Surface Ag: NEGATIVE
RPR Ser Ql: NONREACTIVE

## 2017-05-11 LAB — CHLAMYDIA/GONOCOCCUS/TRICHOMONAS, NAA
CHLAMYDIA BY NAA: NEGATIVE
GONOCOCCUS BY NAA: NEGATIVE
TRICH VAG BY NAA: NEGATIVE

## 2017-06-01 ENCOUNTER — Telehealth: Payer: Self-pay

## 2017-06-01 NOTE — Telephone Encounter (Signed)
Patient needs refill of his Hydroxyzine.   Mom reports he was supposed to get 25 mg last time but was given 10 mg and so he has been doubling up on his medication.  Uses [facility] in [place], [place].

## 2017-06-02 MED ORDER — HYDROXYZINE HCL 25 MG PO TABS
25.0000 mg | ORAL_TABLET | Freq: Three times a day (TID) | ORAL | 1 refills | Status: DC | PRN
Start: 2017-06-02 — End: 2018-05-31

## 2017-06-02 NOTE — Telephone Encounter (Signed)
Pt's mother is aware  

## 2017-06-02 NOTE — Telephone Encounter (Signed)
Please let patient know that I have sent in the hydroxyzine

## 2017-08-15 ENCOUNTER — Encounter: Payer: 59 | Admitting: Family Medicine

## 2018-04-25 ENCOUNTER — Ambulatory Visit: Payer: 59 | Admitting: Family Medicine

## 2018-04-25 ENCOUNTER — Encounter: Payer: Self-pay | Admitting: Family Medicine

## 2018-04-25 VITALS — BP 134/81 | HR 103 | Temp 99.3°F | Ht 70.0 in | Wt 163.6 lb

## 2018-04-25 DIAGNOSIS — R509 Fever, unspecified: Secondary | ICD-10-CM | POA: Diagnosis not present

## 2018-04-25 DIAGNOSIS — I889 Nonspecific lymphadenitis, unspecified: Secondary | ICD-10-CM | POA: Diagnosis not present

## 2018-04-25 DIAGNOSIS — J029 Acute pharyngitis, unspecified: Secondary | ICD-10-CM | POA: Diagnosis not present

## 2018-04-25 DIAGNOSIS — R591 Generalized enlarged lymph nodes: Secondary | ICD-10-CM | POA: Diagnosis not present

## 2018-04-25 LAB — CULTURE, GROUP A STREP

## 2018-04-25 LAB — RAPID STREP SCREEN (MED CTR MEBANE ONLY): STREP GP A AG, IA W/REFLEX: NEGATIVE

## 2018-04-25 NOTE — Progress Notes (Signed)
BP 134/81   Pulse (!) 103   Temp 99.3 F (37.4 C) (Oral)   Ht 5\' 10"  (1.778 m)   Wt 163 lb 9.6 oz (74.2 kg)   BMI 23.47 kg/m    Subjective:    Patient ID: Dean Nielsen, male    DOB: [birth year], 22 y.o.   MRN: [id number]  HPI: Dean Nielsen is a 22 y.o. male presenting on 04/25/2018 for Lump on neck and Sore Throat   HPI Cough and sore throat and lymph node on the left side of his neck Patient is coming in with complaints of cough and sore throat and lymph node on the left side of his neck.  His cough and sore throat is been going on for 2 days and the lymph node just popped up today and he wanted to get it checked because it was very sore and he was concerned about it.  He denies any fevers or chills.  He denies any shortness of breath or wheezing.  His cough is been worse at night and early in the morning.  He has been taking ibuprofen and a cold and flu medication for it which have been helping.  He also took his Zyrtec which he did not feel like made a difference.  Relevant past medical, surgical, family and social history reviewed and updated as indicated. Interim medical history since our last visit reviewed. Allergies and medications reviewed and updated.  Review of Systems  Constitutional: Negative for chills and fever.  HENT: Positive for congestion, postnasal drip, rhinorrhea, sinus pressure and sore throat. Negative for ear discharge, ear pain, sneezing and voice change.   Eyes: Negative for pain, discharge, redness and visual disturbance.  Respiratory: Positive for cough. Negative for shortness of breath and wheezing.   Cardiovascular: Negative for chest pain and leg swelling.  Musculoskeletal: Negative for gait problem.  Skin: Negative for rash.  All other systems reviewed and are negative.   Per HPI unless specifically indicated above   Allergies as of 04/25/2018   No Known Allergies     Medication List        Accurate as of 04/25/18  6:12 PM.  Always use your most recent med list.          cetirizine 10 MG chewable tablet Commonly known as:  ZYRTEC Chew 10 mg by mouth daily.   hydrOXYzine 25 MG tablet Commonly known as:  ATARAX/VISTARIL Take 1 tablet (25 mg total) by mouth 3 (three) times daily as needed.          Objective:    BP 134/81   Pulse (!) 103   Temp 99.3 F (37.4 C) (Oral)   Ht 5\' 10"  (1.778 m)   Wt 163 lb 9.6 oz (74.2 kg)   BMI 23.47 kg/m   Wt Readings from Last 3 Encounters:  04/25/18 163 lb 9.6 oz (74.2 kg)  05/08/17 173 lb 6.4 oz (78.7 kg)  04/15/17 175 lb (79.4 kg)    Physical Exam  Constitutional: He is oriented to person, place, and time. He appears well-developed and well-nourished. No distress.  HENT:  Right Ear: Tympanic membrane, external ear and ear canal normal.  Left Ear: Tympanic membrane, external ear and ear canal normal.  Nose: Mucosal edema and rhinorrhea present. No sinus tenderness. No epistaxis. Right sinus exhibits maxillary sinus tenderness. Right sinus exhibits no frontal sinus tenderness. Left sinus exhibits maxillary sinus tenderness. Left sinus exhibits no frontal sinus tenderness.  Mouth/Throat: Uvula is midline and  mucous membranes are normal. Posterior oropharyngeal edema and posterior oropharyngeal erythema present. No oropharyngeal exudate or tonsillar abscesses.  Eyes: Pupils are equal, round, and reactive to light. Conjunctivae and EOM are normal. Right eye exhibits no discharge. No scleral icterus.  Neck: Neck supple. No thyromegaly present.  Cardiovascular: Normal rate, regular rhythm, normal heart sounds and intact distal pulses.  No murmur heard. Pulmonary/Chest: Effort normal and breath sounds normal. No respiratory distress. He has no wheezes. He has no rales.  Musculoskeletal: Normal range of motion. He exhibits no edema.  Lymphadenopathy:    He has cervical adenopathy (Left anterior cervical lymph node, less than 1 cm, tender to palpation).  Neurological:  He is alert and oriented to person, place, and time. Coordination normal.  Skin: Skin is warm and dry. No rash noted. He is not diaphoretic.  Psychiatric: He has a normal mood and affect. His behavior is normal.  Nursing note and vitals reviewed.   Rapid strep negative    Assessment & Plan:   Problem List Items Addressed This Visit      Other   Sore throat - Primary   Relevant Orders   CBC with Differential/Platelet   Rapid Strep Screen ([facility] ONLY)   Mononucleosis screen    Other Visit Diagnoses    Cervical lymphadenitis        Likely viral in nature but patient wants to get tested for mono because he thinks is been exposed to it.  Rapid strep was negative, recommended salt water gargles and ibuprofen and handwashing.  Follow up plan: Return if symptoms worsen or fail to improve.  Counseling provided for all of the vaccine components Orders Placed This Encounter  Procedures  . Rapid Strep Screen ([facility] ONLY)  . CBC with Differential/Platelet  . Mononucleosis screen    Caryl Pina, MD [facility] 04/25/2018, 6:12 PM

## 2018-04-26 ENCOUNTER — Other Ambulatory Visit: Payer: Self-pay | Admitting: Family

## 2018-04-26 ENCOUNTER — Telehealth: Payer: Self-pay

## 2018-04-26 LAB — CBC WITH DIFFERENTIAL/PLATELET
Basophils Absolute: 0 x10E3/uL (ref 0.0–0.2)
Basos: 1 %
EOS (ABSOLUTE): 0.1 x10E3/uL (ref 0.0–0.4)
Eos: 1 %
Hematocrit: 46.5 % (ref 37.5–51.0)
Hemoglobin: 15.7 g/dL (ref 13.0–17.7)
Immature Grans (Abs): 0 x10E3/uL (ref 0.0–0.1)
Immature Granulocytes: 0 %
Lymphocytes Absolute: 1.4 x10E3/uL (ref 0.7–3.1)
Lymphs: 16 %
MCH: 30.4 pg (ref 26.6–33.0)
MCHC: 33.8 g/dL (ref 31.5–35.7)
MCV: 90 fL (ref 79–97)
Monocytes Absolute: 0.8 x10E3/uL (ref 0.1–0.9)
Monocytes: 9 %
Neutrophils Absolute: 6.3 x10E3/uL (ref 1.4–7.0)
Neutrophils: 73 %
Platelets: 286 x10E3/uL (ref 150–450)
RBC: 5.16 x10E6/uL (ref 4.14–5.80)
RDW: 12 % — ABNORMAL LOW (ref 12.3–15.4)
WBC: 8.6 x10E3/uL (ref 3.4–10.8)

## 2018-04-26 LAB — MONONUCLEOSIS SCREEN: Mono Screen: NEGATIVE

## 2018-04-26 MED ORDER — AZITHROMYCIN 250 MG PO TABS: ORAL_TABLET | ORAL | 0 refills | Status: DC

## 2018-04-26 NOTE — Telephone Encounter (Signed)
Patient seen Dr. Warrick Parisian lastnight.  Mom states he is no better and still has cough and fever this morning of 101.8.  Would like abx sent to [facility] in [place]. Covering provider seen lastnight please advise

## 2018-04-26 NOTE — Addendum Note (Signed)
Addended by: Karle Plumber on: 04/26/2018 08:34 AM   Modules accepted: Orders

## 2018-04-26 NOTE — Telephone Encounter (Signed)
Mom aware and verbalizes understanding.  

## 2018-04-26 NOTE — Telephone Encounter (Signed)
Zpak Prescription sent to pharmacy. Per providers note this more than likely viral and should hold off on starting antibiotics until symptoms worsen.

## 2018-04-27 ENCOUNTER — Other Ambulatory Visit: Payer: Self-pay | Admitting: *Deleted

## 2018-04-27 DIAGNOSIS — R591 Generalized enlarged lymph nodes: Secondary | ICD-10-CM

## 2018-04-27 DIAGNOSIS — J029 Acute pharyngitis, unspecified: Secondary | ICD-10-CM

## 2018-04-27 DIAGNOSIS — R509 Fever, unspecified: Secondary | ICD-10-CM

## 2018-04-27 LAB — URINALYSIS, COMPLETE
Bilirubin, UA: NEGATIVE
GLUCOSE, UA: NEGATIVE
KETONES UA: NEGATIVE
Leukocytes, UA: NEGATIVE
NITRITE UA: NEGATIVE
PROTEIN UA: NEGATIVE
RBC, UA: NEGATIVE
SPEC GRAV UA: 1.02 (ref 1.005–1.030)
UUROB: 0.2 mg/dL (ref 0.2–1.0)
pH, UA: 7.5 (ref 5.0–7.5)

## 2018-04-27 LAB — MICROSCOPIC EXAMINATION
RBC, UA: NONE SEEN /hpf (ref 0–2)
Renal Epithel, UA: NONE SEEN /hpf
WBC, UA: NONE SEEN /hpf (ref 0–5)

## 2018-04-27 NOTE — Addendum Note (Signed)
Addended by: Liliane Bade on: 04/27/2018 11:18 AM   Modules accepted: Orders

## 2018-04-27 NOTE — Addendum Note (Signed)
Addended by: Earlene Plater on: 04/27/2018 02:56 PM   Modules accepted: Orders

## 2018-04-28 LAB — EPSTEIN-BARR VIRUS VCA ANTIBODY PANEL
EBV NA IgG: <18 U/mL (ref 0.0–17.9)
EBV VCA IgG: <18 U/mL (ref 0.0–17.9)
EBV VCA IgM: <36 U/mL (ref 0.0–35.9)

## 2018-04-28 LAB — SPECIMEN STATUS REPORT

## 2018-04-28 LAB — GC/CHLAMYDIA PROBE AMP
CHLAMYDIA, DNA PROBE: NEGATIVE
Neisseria gonorrhoeae by PCR: NEGATIVE

## 2018-04-28 LAB — URINE CULTURE

## 2018-04-30 LAB — CULTURE, GROUP A STREP

## 2018-05-08 NOTE — Progress Notes (Signed)
Subjective:    Patient ID: Dean Nielsen, male    DOB: [birth year], 22 y.o.   MRN: [id number]  HPI  Pt is here today for annual wellness exam.  Pt also here for recheck of anxiety and depression.  She comes in today for a physical exam.  He does complain of a sore throat that he has had for a week and a half.  This started initially with a slight fever and then he does have a slight cough and is been coughing up sputum which initially was green and is now yellow.  He also complains of diminished hearing.  He denies any chest pain.  He denies any shortness of breath other than what he has with a recent cold and cough.  He denies any trouble with his stomach including nausea vomiting diarrhea blood in the stool black tarry bowel movements or change in bowel habits.  He is passing his water without problems.  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  He is not aware of any health issues with his father other than him being very obese and his father has a family history of cancer.  His mother has hyperlipidemia.  The patient's vital signs today are stable and he is afebrile.  The patient does have a history of depression and generalized anxiety disorder.  He also has a lot of sore throats.  He has had some constipation issues in the past.      Patient Active Problem List   Diagnosis Date Noted  . Depression, major, single episode, mild ([place]) 05/08/2017  . Generalized anxiety disorder 05/08/2017  . H/O melanoma excision 10/25/2016  . Sore throat 09/26/2016  . Chronic constipation 09/18/2012   Outpatient Encounter Medications as of 05/09/2018  Medication Sig  . cetirizine (ZYRTEC) 10 MG chewable tablet Chew 10 mg by mouth daily.  . hydrOXYzine (ATARAX/VISTARIL) 25 MG tablet Take 1 tablet (25 mg total) by mouth 3 (three) times daily as needed. (Patient not taking: Reported on 05/09/2018)  . [DISCONTINUED]  azithromycin (ZITHROMAX) 250 MG tablet Take 500 mg once, then 250 mg for four days   No facility-administered encounter medications on file as of 05/09/2018.          Review of Systems  Constitutional: Positive for fatigue (persistent).  HENT: Positive for postnasal drip and rhinorrhea.   Musculoskeletal: Positive for back pain (cervical area).  Neurological: Negative for headaches.       Objective:   Physical Exam  Constitutional: He is oriented to person, place, and time. He appears well-developed and well-nourished. No distress.  Patient is pleasant and relaxed but has persistent nasal congestion and cough.  This is been going on for a good while.  HENT:  Head: Normocephalic and atraumatic.  Right Ear: External ear normal.  Left Ear: External ear normal.  Mouth/Throat: Oropharynx is clear and moist. No oropharyngeal exudate.  Throat red posteriorly and anterior cervical adenopathy prominent on the left with thyroid fullness on the right side.  Nasal turbinate congestion.  Eyes: Pupils are equal, round, and reactive to light. Conjunctivae and EOM are normal. Right eye exhibits no discharge. Left eye exhibits no discharge. No scleral icterus.  Patient has not had an eye exam in over a couple of years and will schedule this.  Neck: Normal range of motion. Neck supple. No thyromegaly present.  Cardiovascular: Normal rate, regular rhythm,  normal heart sounds and intact distal pulses.  No murmur heard. The heart is regular at 84/min no edema and good pedal pulses  Pulmonary/Chest: Effort normal and breath sounds normal. No respiratory distress. He has no wheezes. He has no rales. He exhibits no tenderness.  Dry cough, otherwise clear anteriorly and posteriorly  Abdominal: Soft. Bowel sounds are normal. He exhibits no mass. There is no tenderness. There is no rebound and no guarding.  No liver or spleen enlargement no epigastric or suprapubic tenderness no masses no bruits    Genitourinary: Penis normal.  Genitourinary Comments: A rectal exam was not done.  He was checked for inguinal hernias and testicular abnormalities and no inguinal hernias or abnormal testicle lumps were noted.  No inguinal adenopathy.  Musculoskeletal: Normal range of motion. He exhibits no edema.  Lymphadenopathy:    He has cervical adenopathy.  Neurological: He is alert and oriented to person, place, and time. He has normal reflexes. No cranial nerve deficit.  Skin: Skin is warm and dry. No rash noted.  Psychiatric: He has a normal mood and affect. His behavior is normal. Judgment and thought content normal.  The patient's mood affect and behavior appeared normal  Nursing note and vitals reviewed.  Chest x-ray with results pending Rapid strep test with results pending       Assessment & Plan:  1. Wellness examination -Patient will arrange to get eye exam - CBC with Differential/Platelet; Future - Basic Metabolic Panel; Future - Lipid panel; Future - Hepatic function panel; Future - Urinalysis, Routine w reflex microscopic; Future - DG Chest 2 View; Future  2. Cough -Take amoxicillin 500, 3 times daily with food for infection until completed -Take Mucinex maximum strength 1 twice daily with a large glass of water  3. Seasonal allergic rhinitis due to pollen -Continue with Zyrtec and use nasal saline  4. Sore throat -Amoxicillin 503 times daily until completed for 10 days  5. Bronchitis -Take Mucinex for cough and congestion  No orders of the defined types were placed in this encounter.  Patient Instructions  Continue current medications. Continue good therapeutic lifestyle changes which include good diet and exercise. Fall precautions discussed with patient. If an FOBT was given today- please return it to our front desk. If you are over 67 years old - you may need Prevnar 2 or the adult Pneumonia vaccine.  **Flu shots are available--- please call and schedule a  FLU-CLINIC appointment**  After your visit with [place] today you will receive a survey in the mail or online from [employer] regarding your care with [place]. Please take a moment to fill this out. Your feedback is very important to [place] as you can help [place] better understand your patient needs as well as improve your experience and satisfaction. WE CARE ABOUT YOU!!!   Take amoxicillin 500 3 times a day with food for 10 days Use Mucinex maximum strength, blue and white in color, 1 twice daily with a large glass of water for cough and congestion Take Tylenol for aches pains and fever Use nasal saline as directed frequently through the day Do not use an overhead fan in your bedroom at night Keep the house as cool as possible Drink plenty of fluids We will call with lab work results as soon as these results become available Do not forget to get a routine eye exam and make sure that we are sent a copy of this report  Arrie Senate MD

## 2018-05-08 NOTE — Patient Instructions (Addendum)
Continue current medications. Continue good therapeutic lifestyle changes which include good diet and exercise. Fall precautions discussed with patient. If an FOBT was given today- please return it to our front desk. If you are over 22 years old - you may need Prevnar 72 or the adult Pneumonia vaccine.  **Flu shots are available--- please call and schedule a FLU-CLINIC appointment**  After your visit with [place] today you will receive a survey in the mail or online from [employer] regarding your care with [place]. Please take a moment to fill this out. Your feedback is very important to [place] as you can help [place] better understand your patient needs as well as improve your experience and satisfaction. WE CARE ABOUT YOU!!!   Take amoxicillin 500 3 times a day with food for 10 days Use Mucinex maximum strength, blue and white in color, 1 twice daily with a large glass of water for cough and congestion Take Tylenol for aches pains and fever Use nasal saline as directed frequently through the day Do not use an overhead fan in your bedroom at night Keep the house as cool as possible Drink plenty of fluids We will call with lab work results as soon as these results become available Do not forget to get a routine eye exam and make sure that we are sent a copy of this report

## 2018-05-09 ENCOUNTER — Ambulatory Visit (INDEPENDENT_AMBULATORY_CARE_PROVIDER_SITE_OTHER): Payer: 59

## 2018-05-09 ENCOUNTER — Encounter: Payer: Self-pay | Admitting: Family Medicine

## 2018-05-09 ENCOUNTER — Ambulatory Visit: Payer: 59 | Admitting: Family Medicine

## 2018-05-09 VITALS — BP 100/65 | HR 94 | Temp 97.8°F | Ht 70.0 in | Wt 163.8 lb

## 2018-05-09 DIAGNOSIS — R059 Cough, unspecified: Secondary | ICD-10-CM

## 2018-05-09 DIAGNOSIS — J029 Acute pharyngitis, unspecified: Secondary | ICD-10-CM

## 2018-05-09 DIAGNOSIS — Z0000 Encounter for general adult medical examination without abnormal findings: Secondary | ICD-10-CM

## 2018-05-09 DIAGNOSIS — J40 Bronchitis, not specified as acute or chronic: Secondary | ICD-10-CM

## 2018-05-09 DIAGNOSIS — R05 Cough: Secondary | ICD-10-CM

## 2018-05-09 DIAGNOSIS — R509 Fever, unspecified: Secondary | ICD-10-CM

## 2018-05-09 DIAGNOSIS — J301 Allergic rhinitis due to pollen: Secondary | ICD-10-CM

## 2018-05-09 LAB — MICROSCOPIC EXAMINATION
Bacteria, UA: NONE SEEN
Renal Epithel, UA: NONE SEEN /hpf

## 2018-05-09 LAB — URINALYSIS, ROUTINE W REFLEX MICROSCOPIC
Bilirubin, UA: NEGATIVE
Glucose, UA: NEGATIVE
Ketones, UA: NEGATIVE
LEUKOCYTES UA: NEGATIVE
NITRITE UA: NEGATIVE
PH UA: 5.5 (ref 5.0–7.5)
Protein, UA: NEGATIVE
RBC, UA: NEGATIVE
Urobilinogen, Ur: 0.2 mg/dL (ref 0.2–1.0)

## 2018-05-09 MED ORDER — AMOXICILLIN 500 MG PO CAPS: 500.0000 mg | ORAL_CAPSULE | Freq: Three times a day (TID) | ORAL | 0 refills | Status: DC

## 2018-05-09 NOTE — Addendum Note (Signed)
Addended by: Marin Olp on: 05/09/2018 08:30 AM   Modules accepted: Orders, SmartSet

## 2018-05-09 NOTE — Addendum Note (Signed)
Addended by: Marin Olp on: 05/09/2018 08:32 AM   Modules accepted: Orders

## 2018-05-09 NOTE — Addendum Note (Signed)
Addended by: Earlene Plater on: 05/09/2018 11:27 AM   Modules accepted: Orders

## 2018-05-09 NOTE — Addendum Note (Signed)
Addended by: Marin Olp on: 05/09/2018 10:45 AM   Modules accepted: Orders

## 2018-05-10 LAB — BASIC METABOLIC PANEL
BUN/Creatinine Ratio: 10 (ref 9–20)
BUN: 10 mg/dL (ref 6–20)
CO2: 23 mmol/L (ref 20–29)
Calcium: 10 mg/dL (ref 8.7–10.2)
Chloride: 100 mmol/L (ref 96–106)
Creatinine, Ser: 1.05 mg/dL (ref 0.76–1.27)
GFR calc Af Amer: 116 mL/min/{1.73_m2} (ref >59)
GFR calc non Af Amer: 100 mL/min/{1.73_m2} (ref >59)
Glucose: 92 mg/dL (ref 65–99)
Potassium: 4.3 mmol/L (ref 3.5–5.2)
Sodium: 142 mmol/L (ref 134–144)

## 2018-05-10 LAB — THYROID PANEL WITH TSH
Free Thyroxine Index: 2 (ref 1.2–4.9)
T3 Uptake Ratio: 31 % (ref 24–39)
T4, Total: 6.6 ug/dL (ref 4.5–12.0)
TSH: 1.38 u[IU]/mL (ref 0.450–4.500)

## 2018-05-10 LAB — HEPATIC FUNCTION PANEL
ALT: 16 IU/L (ref 0–44)
AST: 20 IU/L (ref 0–40)
Albumin: 4.8 g/dL (ref 3.5–5.5)
Alkaline Phosphatase: 79 IU/L (ref 39–117)
BILIRUBIN TOTAL: 0.9 mg/dL (ref 0.0–1.2)
BILIRUBIN, DIRECT: 0.18 mg/dL (ref 0.00–0.40)
TOTAL PROTEIN: 7.9 g/dL (ref 6.0–8.5)

## 2018-05-10 LAB — CBC WITH DIFFERENTIAL/PLATELET
Basophils Absolute: 0.1 10*3/uL (ref 0.0–0.2)
Basos: 1 %
EOS (ABSOLUTE): 0.2 10*3/uL (ref 0.0–0.4)
Eos: 2 %
Hematocrit: 49.8 % (ref 37.5–51.0)
Hemoglobin: 16.9 g/dL (ref 13.0–17.7)
Immature Grans (Abs): 0 10*3/uL (ref 0.0–0.1)
Immature Granulocytes: 1 %
Lymphocytes Absolute: 2.2 10*3/uL (ref 0.7–3.1)
Lymphs: 26 %
MCH: 29.9 pg (ref 26.6–33.0)
MCHC: 33.9 g/dL (ref 31.5–35.7)
MCV: 88 fL (ref 79–97)
Monocytes Absolute: 0.7 10*3/uL (ref 0.1–0.9)
Monocytes: 8 %
Neutrophils Absolute: 5.4 10*3/uL (ref 1.4–7.0)
Neutrophils: 62 %
PLATELETS: 421 10*3/uL (ref 150–450)
RBC: 5.65 x10E6/uL (ref 4.14–5.80)
RDW: 11.8 % — ABNORMAL LOW (ref 12.3–15.4)
WBC: 8.6 10*3/uL (ref 3.4–10.8)

## 2018-05-10 LAB — LIPID PANEL
CHOLESTEROL TOTAL: 182 mg/dL (ref 100–199)
Chol/HDL Ratio: 3.7 ratio (ref 0.0–5.0)
HDL: 49 mg/dL (ref >39)
LDL Calculated: 117 mg/dL — ABNORMAL HIGH (ref 0–99)
Triglycerides: 81 mg/dL (ref 0–149)
VLDL Cholesterol Cal: 16 mg/dL (ref 5–40)

## 2018-05-10 LAB — URINE CULTURE: Organism ID, Bacteria: NO GROWTH

## 2018-05-11 LAB — UPPER RESPIRATORY CULTURE, ROUTINE

## 2018-05-31 ENCOUNTER — Encounter: Payer: Self-pay | Admitting: Family Medicine

## 2018-05-31 ENCOUNTER — Ambulatory Visit (INDEPENDENT_AMBULATORY_CARE_PROVIDER_SITE_OTHER): Payer: 59 | Admitting: Family Medicine

## 2018-05-31 ENCOUNTER — Other Ambulatory Visit: Payer: Self-pay | Admitting: *Deleted

## 2018-05-31 VITALS — BP 98/61 | HR 81 | Temp 97.1°F | Ht 70.0 in | Wt 162.0 lb

## 2018-05-31 DIAGNOSIS — J0100 Acute maxillary sinusitis, unspecified: Secondary | ICD-10-CM

## 2018-05-31 DIAGNOSIS — Z8709 Personal history of other diseases of the respiratory system: Secondary | ICD-10-CM | POA: Diagnosis not present

## 2018-05-31 DIAGNOSIS — B954 Other streptococcus as the cause of diseases classified elsewhere: Secondary | ICD-10-CM | POA: Diagnosis not present

## 2018-05-31 DIAGNOSIS — J301 Allergic rhinitis due to pollen: Secondary | ICD-10-CM | POA: Diagnosis not present

## 2018-05-31 MED ORDER — MOXIFLOXACIN HCL 400 MG PO TABS: 400.0000 mg | ORAL_TABLET | Freq: Every day | ORAL | 0 refills | Status: DC

## 2018-05-31 MED ORDER — BETAMETHASONE SOD PHOS & ACET 6 (3-3) MG/ML IJ SUSP
6.0000 mg | Freq: Once | INTRAMUSCULAR | Status: AC
  Administered 2018-05-31: 6 mg via INTRAMUSCULAR

## 2018-05-31 MED ORDER — CEFTRIAXONE SODIUM 1 G IJ SOLR
1.0000 g | Freq: Once | INTRAMUSCULAR | Status: AC
  Administered 2018-05-31: 1 g via INTRAMUSCULAR

## 2018-05-31 NOTE — Progress Notes (Signed)
Subjective:  Patient ID: Dean Nielsen, male    DOB: 08-22-1995  Age: 22 y.o. MRN: [id number]  CC: Sore Throat (neck soreness)   HPI Dakhari Zuver presents for severethroat pain persistent since tx for strep. Culture showed Group G. Using advil just so he can swallow. Symptoms include congestion, facial pain, nasal congestion, scant cough, post nasal drip and sinus pressure under the eyes. Hearing has been diminished AU.  There is no fever, chills, or sweats. Onset of symptoms was several weeks agol, gradually worsening since that time.    Depression screen [facility] 2/9 05/31/2018 05/09/2018 04/25/2018  Decreased Interest 0 0 0  Down, Depressed, Hopeless 0 0 0  PHQ - 2 Score 0 0 0  Altered sleeping 0 0 -  Tired, decreased energy 0 0 -  Change in appetite 0 0 -  Feeling bad or failure about yourself  0 0 -  Trouble concentrating 0 0 -  Moving slowly or fidgety/restless 0 0 -  Suicidal thoughts 0 0 -  PHQ-9 Score 0 0 -  Difficult doing work/chores - - -  Some recent data might be hidden    History Spike has a past medical history of Allergy, Cystitis, Distal radial fracture (01/03/2006), Spina bifida occulta, Urethral stricture unspecified (08/07/2009), and Urinary tract infection.   He has a past surgical history that includes Cystoscopy (08/07/2009) and RETROGRADE URETHROGRAM (08/07/2009).   His family history includes Cancer in his maternal grandfather; Heart disease in his maternal grandfather; Hyperlipidemia in his maternal grandfather and mother.He reports that he has never smoked. He has never used smokeless tobacco. He reports that he does not drink alcohol or use drugs.    ROS Review of Systems  Constitutional: Negative for activity change, appetite change, chills and fever.  HENT: Positive for congestion, postnasal drip, rhinorrhea and sinus pressure. Negative for ear discharge, ear pain, hearing loss, nosebleeds, sneezing and trouble swallowing.   Respiratory:  Negative for chest tightness and shortness of breath.   Cardiovascular: Negative for chest pain and palpitations.  Skin: Negative for rash.    Objective:  BP 98/61   Pulse 81   Temp (!) 97.1 F (36.2 C) (Oral)   Ht _0  (1.778 m)   Wt 162 lb (73.5 kg)   BMI 23.24 kg/m   BP Readings from Last 3 Encounters:  05/31/18 98/61  05/09/18 100/65  04/25/18 134/81    Wt Readings from Last 3 Encounters:  05/31/18 162 lb (73.5 kg)  05/09/18 163 lb 12.8 oz (74.3 kg)  04/25/18 163 lb 9.6 oz (74.2 kg)     Physical Exam Constitutional:      Appearance: He is well-developed.  HENT:     Head: Normocephalic and atraumatic.     Right Ear: Tympanic membrane and external ear normal. No decreased hearing noted.     Left Ear: Tympanic membrane and external ear normal. No decreased hearing noted.     Nose: Mucosal edema and congestion present.     Right Sinus: No frontal sinus tenderness.     Left Sinus: No frontal sinus tenderness.     Mouth/Throat:     Mouth: Mucous membranes are moist. No oral lesions.     Pharynx: Uvula midline. Posterior oropharyngeal erythema present. No oropharyngeal exudate or uvula swelling.     Tonsils: No tonsillar exudate or tonsillar abscesses. Swelling: 1+ on the right. 1+ on the left.  Neck:     Meningeal: Brudzinski's sign absent.  Pulmonary:  Effort: No respiratory distress.     Breath sounds: Normal breath sounds.  Lymphadenopathy:     Head:     Right side of head: No preauricular adenopathy.     Left side of head: No preauricular adenopathy.     Cervical: Cervical adenopathy (1 cm left anterior cervical node at the angle of the mandible) present.     Right cervical: No superficial cervical adenopathy.    Left cervical: No superficial cervical adenopathy.       Assessment & Plan:   Jaisean was seen today for sore throat.  Diagnoses and all orders for this visit:  Acute maxillary sinusitis, recurrence not specified  History of sore  throat -     Upper Respiratory Culture, Routine -     CBC with Differential/Platelet -     CMP14+EGFR  Group G streptococcal infection  Seasonal allergic rhinitis due to pollen       I have discontinued Denyse Amass R. Funk's hydrOXYzine and amoxicillin. I am also having him maintain his cetirizine.  Allergies as of 05/31/2018   No Known Allergies     Medication List       Accurate as of May 31, 2018  7:17 PM. Always use your most recent med list.        cetirizine 10 MG chewable tablet Commonly known as:  ZYRTEC Chew 10 mg by mouth daily.      Alternate xyzal, allegra and zyrtec every 4 months  Follow-up: No follow-ups on file.  Claretta Fraise, M.D.

## 2018-06-01 LAB — CMP14+EGFR
ALBUMIN: 4 g/dL (ref 3.5–5.5)
ALT: 26 IU/L (ref 0–44)
AST: 33 IU/L (ref 0–40)
Albumin/Globulin Ratio: 1.9 (ref 1.2–2.2)
Alkaline Phosphatase: 81 IU/L (ref 39–117)
BUN/Creatinine Ratio: 11 (ref 9–20)
BUN: 10 mg/dL (ref 6–20)
Bilirubin Total: 0.3 mg/dL (ref 0.0–1.2)
CALCIUM: 8.5 mg/dL — AB (ref 8.7–10.2)
CO2: 20 mmol/L (ref 20–29)
Chloride: 102 mmol/L (ref 96–106)
Creatinine, Ser: 0.89 mg/dL (ref 0.76–1.27)
GFR calc Af Amer: 140 mL/min/{1.73_m2} (ref >59)
GFR, EST NON AFRICAN AMERICAN: 121 mL/min/{1.73_m2} (ref >59)
Globulin, Total: 2.1 g/dL (ref 1.5–4.5)
Sodium: 139 mmol/L (ref 134–144)
Total Protein: 6.1 g/dL (ref 6.0–8.5)

## 2018-06-01 LAB — CBC WITH DIFFERENTIAL/PLATELET
BASOS: 1 %
Basophils Absolute: 0.1 10*3/uL (ref 0.0–0.2)
EOS (ABSOLUTE): 0.4 10*3/uL (ref 0.0–0.4)
Eos: 2 %
Hematocrit: 41.1 % (ref 37.5–51.0)
Hemoglobin: 14.3 g/dL (ref 13.0–17.7)
IMMATURE GRANS (ABS): 0 10*3/uL (ref 0.0–0.1)
Immature Granulocytes: 0 %
Lymphocytes Absolute: 10.7 10*3/uL — ABNORMAL HIGH (ref 0.7–3.1)
Lymphs: 69 %
MCH: 30 pg (ref 26.6–33.0)
MCHC: 34.8 g/dL (ref 31.5–35.7)
MCV: 86 fL (ref 79–97)
Monocytes Absolute: 1.8 10*3/uL — ABNORMAL HIGH (ref 0.1–0.9)
Monocytes: 12 %
Neutrophils Absolute: 2.4 10*3/uL (ref 1.4–7.0)
Neutrophils: 16 %
Platelets: 186 10*3/uL (ref 150–450)
RBC: 4.76 x10E6/uL (ref 4.14–5.80)
RDW: 12.3 % (ref 12.3–15.4)
WBC: 15.4 10*3/uL — ABNORMAL HIGH (ref 3.4–10.8)

## 2018-06-01 NOTE — Progress Notes (Signed)
Hello Mayra,  Your lab result is normal.Some minor variations that are not significant are commonly marked abnormal, but do not represent any medical problem for you.  Best regards, Claretta Fraise, M.D.

## 2018-06-02 LAB — UPPER RESPIRATORY CULTURE, ROUTINE

## 2018-07-02 ENCOUNTER — Other Ambulatory Visit: Payer: Self-pay | Admitting: *Deleted

## 2018-07-02 DIAGNOSIS — Z8709 Personal history of other diseases of the respiratory system: Secondary | ICD-10-CM

## 2018-07-02 NOTE — Progress Notes (Signed)
ree

## 2018-07-02 NOTE — Addendum Note (Signed)
Addended by: Earlene Plater on: 07/02/2018 10:50 AM   Modules accepted: Orders

## 2018-07-04 LAB — UPPER RESPIRATORY CULTURE, ROUTINE

## 2018-08-11 ENCOUNTER — Ambulatory Visit: Payer: 59

## 2018-08-29 ENCOUNTER — Telehealth: Payer: Self-pay | Admitting: *Deleted

## 2018-08-29 ENCOUNTER — Encounter: Payer: Self-pay | Admitting: *Deleted

## 2018-08-29 ENCOUNTER — Other Ambulatory Visit: Payer: Self-pay | Admitting: Family Medicine

## 2018-08-29 MED ORDER — FEXOFENADINE HCL 180 MG PO TABS: 180.0000 mg | ORAL_TABLET | Freq: Every day | ORAL | 3 refills | Status: DC

## 2018-08-29 NOTE — Telephone Encounter (Signed)
I sent in the requested prescription 

## 2018-08-29 NOTE — Telephone Encounter (Signed)
Pt denies fever or sore throat at this time. Pt would like Allegra called in since Dr Livia Snellen had told him to alternate every 3 months with the Xyzal and Allegra.

## 2018-08-29 NOTE — Telephone Encounter (Signed)
Pt aware.

## 2019-01-07 ENCOUNTER — Ambulatory Visit (INDEPENDENT_AMBULATORY_CARE_PROVIDER_SITE_OTHER): Payer: 59 | Admitting: Family Medicine

## 2019-01-07 ENCOUNTER — Encounter: Payer: Self-pay | Admitting: Family Medicine

## 2019-01-07 ENCOUNTER — Other Ambulatory Visit: Payer: Self-pay | Admitting: Family Medicine

## 2019-01-07 DIAGNOSIS — H9202 Otalgia, left ear: Secondary | ICD-10-CM | POA: Diagnosis not present

## 2019-01-07 MED ORDER — CETIRIZINE HCL 10 MG PO TABS: 10.0000 mg | ORAL_TABLET | Freq: Every day | ORAL | 1 refills | Status: AC

## 2019-01-07 MED ORDER — AMOXICILLIN-POT CLAVULANATE 875-125 MG PO TABS: 1.0000 | ORAL_TABLET | Freq: Two times a day (BID) | ORAL | 0 refills | Status: AC

## 2019-01-07 MED ORDER — FLUTICASONE PROPIONATE 50 MCG/ACT NA SUSP: 2.0000 | Freq: Every day | NASAL | 6 refills | Status: DC

## 2019-01-07 NOTE — Progress Notes (Signed)
Virtual Visit via telephone Note Due to COVID-19 pandemic this visit was conducted virtually. This visit type was conducted due to national recommendations for restrictions regarding the COVID-19 Pandemic (e.g. social distancing, sheltering in place) in an effort to limit this patient's exposure and mitigate transmission in our community. All issues noted in this document were discussed and addressed.  A physical exam was not performed with this format.   I connected with Dean Nielsen on 01/07/19 at 1330 by telephone and verified that I am speaking with the correct person using two identifiers. Dean Nielsen is currently located at home and family is currently with them during visit. The provider, Monia Pouch, FNP is located in their office at time of visit.  I discussed the limitations, risks, security and privacy concerns of performing an evaluation and management service by telephone and the availability of in person appointments. I also discussed with the patient that there may be a patient responsible charge related to this service. The patient expressed understanding and agreed to proceed.  Subjective:  Patient ID: Dean Nielsen, male    DOB: [birth year], 23 y.o.   MRN: [id number]  Chief Complaint:  Otalgia   HPI: Dean Nielsen is a 23 y.o. male presenting on 01/07/2019 for Otalgia   Pt reports left otalgia, low grade fever, and decreased hearing in left ear. States onset a few days ago. Worse today with decreased hearing. No fever. Slight chills. No rhinorrhea, congestion, cough, or sore throat.   Otalgia  There is pain in the left ear. This is a new problem. The current episode started in the past 7 days. The problem occurs constantly. The problem has been gradually worsening. There has been no fever. The pain is at a severity of 5/10. The pain is moderate. Associated symptoms include hearing loss. Pertinent negatives include no abdominal pain, coughing,  diarrhea, ear discharge, headaches, neck pain, rash, rhinorrhea, sore throat or vomiting. He has tried ear drops for the symptoms. The treatment provided no relief.     Relevant past medical, surgical, family, and social history reviewed and updated as indicated.  Allergies and medications reviewed and updated.   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 Arlyn Leak    Social History   Socioeconomic History   Marital status: Single    Spouse name: Not on file   Number of children: Not on file   Years of education: Not on file   Highest education level: Not on file  Occupational History   Not on file  Social Needs   Financial resource strain: Not on file   Food insecurity    Worry: Not on file    Inability: Not on file   Transportation needs    Medical: Not on file    Non-medical: Not on file  Tobacco Use   Smoking status: Never Smoker   Smokeless tobacco: Never Used  Substance and Sexual Activity   Alcohol use: No   Drug use: No   Sexual activity: Not on file  Lifestyle   Physical activity  Days per week: Not on file    Minutes per session: Not on file   Stress: Not on file  Relationships   Social connections    Talks on phone: Not on file    Gets together: Not on file    Attends religious service: Not on file    Active member of club or organization: Not on file    Attends meetings of clubs or organizations: Not on file    Relationship status: Not on file   Intimate partner violence    Fear of current or ex partner: Not on file    Emotionally abused: Not on file    Physically  abused: Not on file    Forced sexual activity: Not on file  Other Topics Concern   Not on file  Social History Narrative   Not on file    Outpatient Encounter Medications as of 01/07/2019  Medication Sig   amoxicillin-clavulanate (AUGMENTIN) 875-125 MG tablet Take 1 tablet by mouth 2 (two) times daily for 10 days.   fexofenadine (ALLEGRA) 180 MG tablet Take 1 tablet (180 mg total) by mouth daily. For allergy symptoms   fluticasone (FLONASE) 50 MCG/ACT nasal spray Place 2 sprays into both nostrils daily.   levocetirizine (XYZAL) 5 MG tablet    No facility-administered encounter medications on file as of 01/07/2019.     No Known Allergies  Review of Systems  Constitutional: Positive for chills and fever. Negative for activity change, appetite change, diaphoresis, fatigue and unexpected weight change.  HENT: Positive for ear pain and hearing loss. Negative for congestion, ear discharge, rhinorrhea and sore throat.   Eyes: Negative for photophobia and visual disturbance.  Respiratory: Negative for cough and shortness of breath.   Cardiovascular: Negative for chest pain and palpitations.  Gastrointestinal: Negative for abdominal pain, diarrhea and vomiting.  Musculoskeletal: Negative for neck pain.  Skin: Negative for rash.  Neurological: Negative for dizziness, weakness, light-headedness and headaches.  Hematological: Positive for adenopathy.  Psychiatric/Behavioral: Negative for confusion.  All other systems reviewed and are negative.        Observations/Objective: No vital signs or physical exam, this was a telephone or virtual health encounter.  Pt alert and oriented, answers all questions appropriately, and able to speak in full sentences.    Assessment and Plan: Dean Nielsen was seen today for otalgia.  Diagnoses and all orders for this visit:  Acute otalgia, left Reported symptoms concerning for acute otitis media. Due to ongoing symptoms and low grade fever, will treat  with Augmentin for 10 days. Symptomatic care discussed. Can use flonase daily to help decrease otits effusion. Continue antihistamine as prescribed. Report any new or worsening symptoms. Reevaluation in 2 weeks.  -     amoxicillin-clavulanate (AUGMENTIN) 875-125 MG tablet; Take 1 tablet by mouth 2 (two) times daily for 10 days. -     fluticasone (FLONASE) 50 MCG/ACT nasal spray; Place 2 sprays into both nostrils daily.     Follow Up Instructions: Return in about 2 weeks (around 01/21/2019), or if symptoms worsen or fail to improve, for otalgia.    I discussed the assessment and treatment plan with the patient. The patient was provided an opportunity to ask questions and all were answered. The patient agreed with the plan and demonstrated an understanding of the instructions.   The patient was advised to call back or seek an in-person evaluation if the symptoms worsen or if the condition fails to improve as anticipated.  The above assessment and management  plan was discussed with the patient. The patient verbalized understanding of and has agreed to the management plan. Patient is aware to call the clinic if symptoms persist or worsen. Patient is aware when to return to the clinic for a follow-up visit. Patient educated on when it is appropriate to go to the emergency department.    I provided 15 minutes of non-face-to-face time during this encounter. The call started at 1330. The call ended at 1345. The other time was used for coordination of care.    Monia Pouch, FNP-C [facility] [address] [place],  [address] [phone] 01/07/19

## 2019-01-09 ENCOUNTER — Other Ambulatory Visit: Payer: Self-pay | Admitting: Family Medicine

## 2019-01-09 DIAGNOSIS — H9202 Otalgia, left ear: Secondary | ICD-10-CM

## 2019-01-09 MED ORDER — CIPRODEX 0.3-0.1 % OT SUSP: 4.0000 [drp] | Freq: Two times a day (BID) | OTIC | 0 refills | Status: DC

## 2019-01-09 MED ORDER — OFLOXACIN 0.3 % OT SOLN: 5.0000 [drp] | Freq: Every day | OTIC | 0 refills | Status: AC

## 2019-01-09 NOTE — Progress Notes (Signed)
c 

## 2019-01-14 ENCOUNTER — Ambulatory Visit: Payer: 59 | Admitting: Physician Assistant

## 2019-01-14 ENCOUNTER — Encounter: Payer: Self-pay | Admitting: Physician Assistant

## 2019-01-14 ENCOUNTER — Other Ambulatory Visit: Payer: Self-pay

## 2019-01-14 VITALS — BP 123/74 | HR 80 | Temp 98.2°F | Ht 70.0 in | Wt 172.0 lb

## 2019-01-14 DIAGNOSIS — R109 Unspecified abdominal pain: Secondary | ICD-10-CM

## 2019-01-14 DIAGNOSIS — R319 Hematuria, unspecified: Secondary | ICD-10-CM

## 2019-01-14 LAB — URINALYSIS
Bilirubin, UA: NEGATIVE
Glucose, UA: NEGATIVE
Ketones, UA: NEGATIVE
Leukocytes,UA: NEGATIVE
Nitrite, UA: NEGATIVE
Protein,UA: NEGATIVE
Specific Gravity, UA: 1.01 (ref 1.005–1.030)
Urobilinogen, Ur: 0.2 mg/dL (ref 0.2–1.0)
pH, UA: 7 (ref 5.0–7.5)

## 2019-01-14 MED ORDER — TAMSULOSIN HCL 0.4 MG PO CAPS: 0.4000 mg | ORAL_CAPSULE | Freq: Two times a day (BID) | ORAL | 3 refills | Status: AC

## 2019-01-14 MED ORDER — KETOROLAC TROMETHAMINE 60 MG/2ML IM SOLN
60.0000 mg | Freq: Once | INTRAMUSCULAR | Status: AC
  Administered 2019-01-14: 60 mg via INTRAMUSCULAR

## 2019-01-14 MED ORDER — CIPRO HC 0.2-1 % OT SUSP: 3.0000 [drp] | Freq: Two times a day (BID) | OTIC | 0 refills | Status: DC

## 2019-01-14 NOTE — Patient Instructions (Signed)

## 2019-01-15 ENCOUNTER — Telehealth: Payer: Self-pay | Admitting: *Deleted

## 2019-01-15 ENCOUNTER — Other Ambulatory Visit: Payer: Self-pay | Admitting: Physician Assistant

## 2019-01-15 DIAGNOSIS — R109 Unspecified abdominal pain: Secondary | ICD-10-CM

## 2019-01-15 DIAGNOSIS — R319 Hematuria, unspecified: Secondary | ICD-10-CM

## 2019-01-15 LAB — URINALYSIS, MICROSCOPIC ONLY: Casts: NONE SEEN /lpf

## 2019-01-15 NOTE — Telephone Encounter (Signed)
Yes, it can.

## 2019-01-15 NOTE — Telephone Encounter (Signed)
Pt seen yesterday, waiting on urology appt to [facility] He is peeing blood and having bad left side pain Could a CT be ordered  Please advise

## 2019-01-15 NOTE — Progress Notes (Signed)
BP 123/74   Pulse 80   Temp 98.2 F (36.8 C) (Temporal)   Ht 5\' 10"  (1.778 m)   Wt 172 lb (78 kg)   BMI 24.68 kg/m    Subjective:    Patient ID: Dean Nielsen, male    DOB: 1995/12/25, 23 y.o.   MRN: [id number]  HPI: Dean Nielsen is a 23 y.o. male presenting on 01/14/2019 for Hematuria  Over the past day the patient has had hematuria.  When he first got up he had his urine start and then it was blood and then thinned it was clear.  He has continued to hurt all over his upper abdomen but primarily in the left flank.  At this moment is a 2 out of 10 at the most.  But the pain does come and go.  He does have burning at the urethra.  He does have a known history of surgery to repair bladder and urethra.  He was very young when he had the surgery performed I would like for him to get urology follow-up because of this but also because of possible kidney stone.  Past Medical History:  Diagnosis Date  . Allergy   . 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 Cystitis   . Distal radial fracture 01/03/2006   LEFT  . Spina bifida occulta   . Urethral stricture unspecified 08/07/2009   penoscrotal junction  . Urinary tract infection    Relevant past medical, surgical, family and social history reviewed and updated as indicated. Interim medical history since our last visit reviewed. Allergies and medications reviewed and updated. DATA REVIEWED: CHART IN EPIC  Family History reviewed for pertinent findings.  Review of Systems  Constitutional: Negative.  Negative for appetite change and fatigue.  Eyes: Negative for pain and visual disturbance.  Respiratory: Negative.  Negative for cough, chest tightness, shortness of breath and wheezing.   Cardiovascular: Negative.  Negative for chest pain, palpitations and leg swelling.  Gastrointestinal: Negative.  Negative for abdominal pain, diarrhea, nausea and vomiting.  Genitourinary: Positive for dysuria, flank pain and hematuria.  Negative for difficulty urinating and frequency.  Skin: Negative.  Negative for color change and rash.  Neurological: Negative.  Negative for weakness, numbness and headaches.  Psychiatric/Behavioral: Negative.     Allergies as of 01/14/2019   No Known Allergies     Medication List       Accurate as of January 14, 2019 11:59 PM. If you have any questions, ask your nurse or doctor.        amoxicillin-clavulanate 875-125 MG tablet Commonly known as: AUGMENTIN Take 1 tablet by mouth 2 (two) times daily for 10 days.   cetirizine 10 MG tablet Commonly known as: ZYRTEC Take 1 tablet (10 mg total) by mouth daily.   Cipro HC OTIC suspension Generic drug: ciprofloxacin-hydrocortisone Place 3 drops into the left ear 2 (two) times daily. Started by: Terald Sleeper, PA-C   fexofenadine 180 MG tablet Commonly known as: ALLEGRA Take 1 tablet (180 mg total) by mouth daily. For allergy symptoms   fluticasone 50 MCG/ACT nasal spray Commonly known as: FLONASE Place 2 sprays into both nostrils daily.   levocetirizine 5 MG tablet Commonly known as: XYZAL   ofloxacin 0.3 % OTIC solution Commonly known as: Floxin Otic Place 5 drops into the left ear daily for 7 days.   tamsulosin 0.4 MG Caps capsule Commonly known as: FLOMAX Take 1 capsule (0.4 mg total) by mouth 2 (two)  times daily. For urine flow Started by: Terald Sleeper, PA-C          Objective:    BP 123/74   Pulse 80   Temp 98.2 F (36.8 C) (Temporal)   Ht 5\' 10"  (1.778 m)   Wt 172 lb (78 kg)   BMI 24.68 kg/m   No Known Allergies  Wt Readings from Last 3 Encounters:  01/14/19 172 lb (78 kg)  05/31/18 162 lb (73.5 kg)  05/09/18 163 lb 12.8 oz (74.3 kg)    Physical Exam Vitals signs and nursing note reviewed.  Constitutional:      General: He is not in acute distress.    Appearance: He is well-developed.  HENT:     Head: Normocephalic and atraumatic.  Eyes:     Conjunctiva/sclera: Conjunctivae normal.      Pupils: Pupils are equal, round, and reactive to light.  Cardiovascular:     Rate and Rhythm: Normal rate and regular rhythm.     Heart sounds: Normal heart sounds.  Pulmonary:     Effort: Pulmonary effort is normal. No respiratory distress.     Breath sounds: Normal breath sounds.  Abdominal:     Tenderness: There is left CVA tenderness.  Skin:    General: Skin is warm and dry.  Psychiatric:        Behavior: Behavior normal.     Results for orders placed or performed in visit on 01/14/19  Urinalysis  Result Value Ref Range   Specific Gravity, UA 1.010 1.005 - 1.030   pH, UA 7.0 5.0 - 7.5   Color, UA Yellow Yellow   Appearance Ur Cloudy (A) Clear   Leukocytes,UA Negative Negative   Protein,UA Negative Negative/Trace   Glucose, UA Negative Negative   Ketones, UA Negative Negative   RBC, UA 3+ (A) Negative   Bilirubin, UA Negative Negative   Urobilinogen, Ur 0.2 0.2 - 1.0 mg/dL   Nitrite, UA Negative Negative  Urine Microscopic  Result Value Ref Range   WBC, UA 0-5 0 - 5 /hpf   RBC 0-2 0 - 2 /hpf   Epithelial Cells (non renal) 0-10 0 - 10 /hpf   Casts None seen None seen /lpf   Bacteria, UA Few None seen/Few      Assessment & Plan:   1. Hematuria, unspecified type - Urinalysis - Ambulatory referral to Urology - tamsulosin (FLOMAX) 0.4 MG CAPS capsule; Take 1 capsule (0.4 mg total) by mouth 2 (two) times daily. For urine flow  Dispense: 30 capsule; Refill: 3 - ciprofloxacin-hydrocortisone (CIPRO HC) OTIC suspension; Place 3 drops into the left ear 2 (two) times daily.  Dispense: 10 mL; Refill: 0 - Urine Culture - Urine Microscopic  2. Acute left flank pain - tamsulosin (FLOMAX) 0.4 MG CAPS capsule; Take 1 capsule (0.4 mg total) by mouth 2 (two) times daily. For urine flow  Dispense: 30 capsule; Refill: 3 - ketorolac (TORADOL) injection 60 mg - Urine Culture - Urine Microscopic   Continue all other maintenance medications as listed above.  Follow up plan:  Return if symptoms worsen or fail to improve.  Educational handout given for kidney stone  Terald Sleeper PA-C [facility] [address]  [place], [place] [address] [phone]   01/15/2019, 8:09 PM

## 2019-01-15 NOTE — Telephone Encounter (Signed)
Aware order has been placed

## 2019-01-15 NOTE — Progress Notes (Signed)
Left message to please call our office if having any problems.

## 2019-01-16 ENCOUNTER — Other Ambulatory Visit: Payer: Self-pay | Admitting: Physician Assistant

## 2019-01-16 ENCOUNTER — Telehealth: Payer: Self-pay | Admitting: *Deleted

## 2019-01-16 LAB — URINE CULTURE: Organism ID, Bacteria: NO GROWTH

## 2019-01-16 MED ORDER — CIPROFLOXACIN-DEXAMETHASONE 0.3-0.1 % OT SUSP: 4.0000 [drp] | Freq: Two times a day (BID) | OTIC | 0 refills | Status: DC

## 2019-01-16 NOTE — Telephone Encounter (Signed)
Insurance plan does not cover Cipro-HC Otic Suspension 10 ml (ear)  Please send in alternate or I can try for PA.

## 2019-01-16 NOTE — Telephone Encounter (Signed)
ciprodex sent to pharmacy

## 2019-01-18 ENCOUNTER — Ambulatory Visit
Admission: RE | Admit: 2019-01-18 | Discharge: 2019-01-18 | Disposition: A | Discharge status: Home / Self Care | Payer: 59 | Source: Ambulatory Visit | Attending: Physician Assistant | Admitting: Physician Assistant

## 2019-01-18 DIAGNOSIS — R319 Hematuria, unspecified: Secondary | ICD-10-CM

## 2019-01-18 DIAGNOSIS — R109 Unspecified abdominal pain: Secondary | ICD-10-CM

## 2019-01-23 ENCOUNTER — Telehealth: Payer: Self-pay

## 2019-01-23 ENCOUNTER — Other Ambulatory Visit: Payer: Self-pay | Admitting: Family Medicine

## 2019-01-23 MED ORDER — AZELASTINE HCL 0.1 % NA SOLN: 2.0000 | Freq: Two times a day (BID) | NASAL | 12 refills | Status: DC

## 2019-01-23 MED ORDER — MONTELUKAST SODIUM 10 MG PO TABS: 10.0000 mg | ORAL_TABLET | Freq: Every day | ORAL | 3 refills | Status: AC

## 2019-01-23 MED ORDER — LEVOCETIRIZINE DIHYDROCHLORIDE 5 MG PO TABS: 5.0000 mg | ORAL_TABLET | Freq: Every evening | ORAL | 0 refills | Status: AC

## 2019-01-23 NOTE — Telephone Encounter (Signed)
I switched him back to xyzal and added singulair and astelin

## 2019-01-23 NOTE — Telephone Encounter (Signed)
Mom notified.

## 2019-01-23 NOTE — Telephone Encounter (Signed)
Zyrtec not helping. Wants to know if something else can be called in to [facility] in [place]

## 2019-01-24 ENCOUNTER — Emergency Department (HOSPITAL_COMMUNITY)
Admission: EM | Admit: 2019-01-24 | Discharge: 2019-01-24 | Disposition: A | Discharge status: Home / Self Care | Payer: 59 | Attending: Emergency Medicine | Admitting: Emergency Medicine

## 2019-01-24 ENCOUNTER — Emergency Department (HOSPITAL_COMMUNITY): Payer: 59

## 2019-01-24 ENCOUNTER — Other Ambulatory Visit: Payer: Self-pay

## 2019-01-24 ENCOUNTER — Encounter (HOSPITAL_COMMUNITY): Payer: Self-pay | Admitting: Emergency Medicine

## 2019-01-24 DIAGNOSIS — R1032 Left lower quadrant pain: Secondary | ICD-10-CM | POA: Diagnosis not present

## 2019-01-24 DIAGNOSIS — Z79899 Other long term (current) drug therapy: Secondary | ICD-10-CM | POA: Diagnosis not present

## 2019-01-24 DIAGNOSIS — R1110 Vomiting, unspecified: Secondary | ICD-10-CM | POA: Insufficient documentation

## 2019-01-24 DIAGNOSIS — Z883 Allergy status to other anti-infective agents status: Secondary | ICD-10-CM | POA: Insufficient documentation

## 2019-01-24 DIAGNOSIS — R1012 Left upper quadrant pain: Secondary | ICD-10-CM | POA: Diagnosis not present

## 2019-01-24 DIAGNOSIS — R1030 Lower abdominal pain, unspecified: Secondary | ICD-10-CM

## 2019-01-24 DIAGNOSIS — R109 Unspecified abdominal pain: Secondary | ICD-10-CM

## 2019-01-24 LAB — URINALYSIS, ROUTINE W REFLEX MICROSCOPIC
Bilirubin Urine: NEGATIVE
Glucose, UA: NEGATIVE mg/dL
Ketones, ur: NEGATIVE mg/dL
Leukocytes,Ua: NEGATIVE
Nitrite: NEGATIVE
Protein, ur: NEGATIVE mg/dL
Specific Gravity, Urine: 1.019 (ref 1.005–1.030)
pH: 6 (ref 5.0–8.0)

## 2019-01-24 LAB — COMPREHENSIVE METABOLIC PANEL
ALT: 15 U/L (ref 0–44)
AST: 20 U/L (ref 15–41)
Albumin: 4.6 g/dL (ref 3.5–5.0)
Alkaline Phosphatase: 49 U/L (ref 38–126)
Anion gap: 9 (ref 5–15)
BUN: 17 mg/dL (ref 6–20)
CO2: 25 mmol/L (ref 22–32)
Calcium: 9 mg/dL (ref 8.9–10.3)
Chloride: 106 mmol/L (ref 98–111)
Creatinine, Ser: 1.03 mg/dL (ref 0.61–1.24)
GFR calc Af Amer: >60 mL/min (ref >60)
GFR calc non Af Amer: >60 mL/min (ref >60)
Glucose, Bld: 107 mg/dL — ABNORMAL HIGH (ref 70–99)
Potassium: 3.8 mmol/L (ref 3.5–5.1)
Sodium: 140 mmol/L (ref 135–145)
Total Bilirubin: 0.7 mg/dL (ref 0.3–1.2)
Total Protein: 7.5 g/dL (ref 6.5–8.1)

## 2019-01-24 LAB — CBC WITH DIFFERENTIAL/PLATELET
Abs Immature Granulocytes: 0.03 10*3/uL (ref 0.00–0.07)
Basophils Absolute: 0 10*3/uL (ref 0.0–0.1)
Basophils Relative: 0 %
Eosinophils Absolute: 0.1 10*3/uL (ref 0.0–0.5)
Eosinophils Relative: 1 %
HCT: 43 % (ref 39.0–52.0)
Hemoglobin: 14.7 g/dL (ref 13.0–17.0)
Immature Granulocytes: 0 %
Lymphocytes Relative: 21 %
Lymphs Abs: 1.5 10*3/uL (ref 0.7–4.0)
MCH: 30.8 pg (ref 26.0–34.0)
MCHC: 34.2 g/dL (ref 30.0–36.0)
MCV: 90 fL (ref 80.0–100.0)
Monocytes Absolute: 0.4 10*3/uL (ref 0.1–1.0)
Monocytes Relative: 5 %
Neutro Abs: 5 10*3/uL (ref 1.7–7.7)
Neutrophils Relative %: 73 %
Platelets: 258 10*3/uL (ref 150–400)
RBC: 4.78 MIL/uL (ref 4.22–5.81)
RDW: 11.8 % (ref 11.5–15.5)
WBC: 7 10*3/uL (ref 4.0–10.5)
nRBC: 0 % (ref 0.0–0.2)

## 2019-01-24 LAB — LIPASE, BLOOD: Lipase: 29 U/L (ref 11–51)

## 2019-01-24 MED ORDER — FENTANYL CITRATE (PF) 100 MCG/2ML IJ SOLN
50.0000 ug | Freq: Once | INTRAMUSCULAR | Status: AC
  Administered 2019-01-24: 50 ug via INTRAVENOUS
  Filled 2019-01-24: qty 2

## 2019-01-24 MED ORDER — SODIUM CHLORIDE (PF) 0.9 % IJ SOLN
INTRAMUSCULAR | Status: AC
  Filled 2019-01-24: qty 50

## 2019-01-24 MED ORDER — SODIUM CHLORIDE 0.9 % IV BOLUS
500.0000 mL | Freq: Once | INTRAVENOUS | Status: AC
  Administered 2019-01-24: 500 mL via INTRAVENOUS

## 2019-01-24 MED ORDER — HYDROCODONE-ACETAMINOPHEN 5-325 MG PO TABS: 1.0000 | ORAL_TABLET | ORAL | 0 refills | Status: AC | PRN

## 2019-01-24 MED ORDER — ONDANSETRON 4 MG PO TBDP: 4.0000 mg | ORAL_TABLET | Freq: Three times a day (TID) | ORAL | 0 refills | Status: AC | PRN

## 2019-01-24 MED ORDER — IOHEXOL 300 MG/ML  SOLN
100.0000 mL | Freq: Once | INTRAMUSCULAR | Status: AC | PRN
  Administered 2019-01-24: 100 mL via INTRAVENOUS

## 2019-01-24 NOTE — ED Notes (Signed)
this RN Explained to patient that all our treatment rooms are currently occupied at this time and that he can either continue to wait for next available treatment room in the triage room or in the lobby waiting area, pt waiting for available room in triage room.

## 2019-01-24 NOTE — ED Notes (Signed)
US at bedside

## 2019-01-24 NOTE — ED Provider Notes (Signed)
Pt signed out by Dr. Alvino Chapel pending Scrotal US.  IMPRESSION:  No acute finding or testicular abnormality. Trace bilateral  hydroceles.    CT today:    IMPRESSION:  1. A cause for patient's symptoms has not been established with this  study.    2. No evident renal or ureteral calculus. No hydronephrosis. Urinary  bladder wall thickness is within normal limits.    3. No evident bowel obstruction. No abscess in the abdomen or  pelvis. Appendix appears normal.    4. Hepatic steatosis.    It is possible that pt is having kidney stones and is passing them prior to CT scans, but no stone on CT today.  Pt's pain is gone.  Pt is stable for d/c.  He has an appt with urology on Sept. 8.  Return if worse.   Isla Pence, MD 01/24/19 [phone]

## 2019-01-24 NOTE — ED Provider Notes (Signed)
[facility] DEPT Provider Note   CSN: [id number] Arrival date & time: 01/24/19  1497     History   Chief Complaint Chief Complaint  Patient presents with   Flank Pain    HPI Dean Nielsen is a 23 y.o. male.     HPI Patient presents with left abdominal pain.  Began this morning.  Severe.  Goes from left upper abdomen down to his groin area.  Had some similar pain a week and a half ago.  Had hematuria at that time and was thought to be a kidney stone.  Has had no previous kidney stone.  However 6 days ago had a CT scan for stone that showed no abnormality.  Pain had resolved by that time however.  Pain began again today.  Has had some vomiting.  States he feels that he has to urinate.  Does not feel as if he is obstructed.  Has had previous urethral stricture. 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 Arlyn Leak        Home Medications    Prior to Admission medications   Medication Sig Start Date End Date Taking? Authorizing Provider  acetaminophen (TYLENOL) 325 MG tablet Take 650 mg by mouth every 6 (six) hours as needed for mild pain or headache.   Yes [provider]  azelastine (ASTELIN) 0.1 % nasal spray Place 2 sprays into both nostrils 2 (two) times daily. Use in each nostril as directed 01/23/19  Yes Stacks,  Cletus Gash, MD  carbamide peroxide (DEBROX) 6.5 % OTIC solution Place 5 drops into the left ear once a week.   Yes [provider]  cetirizine (ZYRTEC) 10 MG tablet Take 1 tablet (10 mg total) by mouth daily. 01/07/19  Yes StacksCletus Gash, MD  Multiple Vitamin (MULTIVITAMIN WITH MINERALS) TABS tablet Take 1 tablet by mouth daily.   Yes [provider]  tamsulosin (FLOMAX) 0.4 MG CAPS capsule Take 1 capsule (0.4 mg total) by mouth 2 (two) times daily. For urine flow 01/14/19  Yes Terald Sleeper, PA-C  fexofenadine (ALLEGRA) 180 MG tablet Take 1 tablet (180 mg total) by mouth daily. For allergy symptoms Patient not taking: Reported on 01/24/2019 08/29/18   Claretta Fraise, MD  levocetirizine (XYZAL) 5 MG tablet Take 1 tablet (5 mg total) by mouth every evening. Patient not taking: Reported on 01/24/2019 01/23/19   Claretta Fraise, MD  montelukast (SINGULAIR) 10 MG tablet Take 1 tablet (10 mg total) by mouth at bedtime. Patient not taking: Reported on 01/24/2019 01/23/19   Claretta Fraise, MD    Family History Family History  Problem Relation Age of Onset   Hyperlipidemia Mother    Cancer Maternal Grandfather        Lung Cancer   Heart disease Maternal Grandfather    Hyperlipidemia Maternal Grandfather  Social History Social History   Tobacco Use   Smoking status: Never Smoker   Smokeless tobacco: Never Used  Substance Use Topics   Alcohol use: No   Drug use: No     Allergies   Blistex complete moisture [aquaderm treatment-moisturizer] and Neosporin [bacitracin-polymyxin b]   Review of Systems Review of Systems  Constitutional: Positive for appetite change.  HENT: Negative for congestion.   Respiratory: Negative for shortness of breath.   Cardiovascular: Negative for chest pain.  Gastrointestinal: Positive for abdominal pain, nausea and vomiting. Negative for constipation.  Genitourinary: Positive for flank pain and testicular pain.  Musculoskeletal: Negative  for gait problem.  Skin: Negative for rash and wound.  Neurological: Negative for weakness.  Psychiatric/Behavioral: Negative for confusion.     Physical Exam Updated Vital Signs BP 118/68    Pulse 80    Temp 97.9 F (36.6 C) (Oral)    Resp 16    SpO2 100%   Physical Exam Vitals signs and nursing note reviewed.  Constitutional:      Comments: Patient appears uncomfortable  HENT:     Head: Atraumatic.  Eyes:     Pupils: Pupils are equal, round, and reactive to light.  Neck:     Musculoskeletal: Neck supple.  Cardiovascular:     Rate and Rhythm: Regular rhythm.  Pulmonary:     Breath sounds: No wheezing or rhonchi.  Abdominal:     Tenderness: There is abdominal tenderness.     Comments: Moderate tenderness to left abdomen.  No hernia palpated.    Genitourinary:    Comments: Testicular exam showed mild tenderness on left side.  Cremasteric reflex intact.  No mass. Musculoskeletal:     Right lower leg: No edema.     Left lower leg: No edema.  Skin:    General: Skin is warm.     Capillary Refill: Capillary refill takes less than 2 seconds.  Neurological:     Mental Status: He is alert and oriented to person, place, and time.      ED Treatments / Results  Labs (all labs ordered are listed, but only abnormal results are displayed) Labs Reviewed  URINALYSIS, ROUTINE W REFLEX MICROSCOPIC - Abnormal; Notable for the following components:      Result Value   Hgb urine dipstick SMALL (*)    Bacteria, UA RARE (*)    All other components within normal limits  COMPREHENSIVE METABOLIC PANEL - Abnormal; Notable for the following components:   Glucose, Bld 107 (*)    All other components within normal limits  LIPASE, BLOOD  CBC WITH DIFFERENTIAL/PLATELET    EKG None  Radiology Ct Abdomen Pelvis W Contrast  Result Date: 01/24/2019 CLINICAL DATA:  Left flank pain and dysuria EXAM: CT ABDOMEN AND PELVIS WITH CONTRAST TECHNIQUE: Multidetector CT imaging of the abdomen and  pelvis was performed using the standard protocol following bolus administration of intravenous contrast. CONTRAST:  162mL OMNIPAQUE IOHEXOL 300 MG/ML  SOLN COMPARISON:  January 18, 2019 FINDINGS: Lower chest: Lung bases are clear. Hepatobiliary: There is a degree of hepatic steatosis. No focal liver lesions are evident. Gallbladder wall is not appreciably thickened. There is no biliary duct dilatation. Pancreas: There is no pancreatic mass or inflammatory focus. Spleen: No splenic lesions are evident. Adrenals/Urinary Tract: Adrenals bilaterally appear unremarkable. Kidneys bilaterally show no evident mass or hydronephrosis on either side. There is no demonstrable renal or ureteral calculus on either side. Urinary bladder is midline with wall thickness within normal limits.  Stomach/Bowel: There is no appreciable bowel wall or mesenteric thickening. No evident bowel obstruction. Terminal ileum appears normal. There is no evident free air or portal venous air. Vascular/Lymphatic: There is no abdominal aortic aneurysm. No vascular lesions are evident. There is no evident adenopathy in the abdomen or pelvis. Reproductive: Prostate and seminal vesicles are normal in size and contour. There is no evident pelvic mass. Other: The appendix appears normal. There is no evident abscess or ascites in the abdomen or pelvis. Musculoskeletal: There are no blastic or lytic bone lesions. There is a stable bone island in the right femoral neck. No intramuscular or abdominal wall lesions are evident. IMPRESSION: 1. A cause for patient's symptoms has not been established with this study. 2. No evident renal or ureteral calculus. No hydronephrosis. Urinary bladder wall thickness is within normal limits. 3. No evident bowel obstruction. No abscess in the abdomen or pelvis. Appendix appears normal. 4.  Hepatic steatosis. Electronically Signed   By: Lowella Grip III M.D.   On: 01/24/2019 13:21    Procedures Procedures (including  critical care time)  Medications Ordered in ED Medications  sodium chloride (PF) 0.9 % injection (has no administration in time range)  fentaNYL (SUBLIMAZE) injection 50 mcg (50 mcg Intravenous Given 01/24/19 1145)  sodium chloride 0.9 % bolus 500 mL (0 mLs Intravenous Stopped 01/24/19 1216)  iohexol (OMNIPAQUE) 300 MG/ML solution 100 mL (100 mLs Intravenous Contrast Given 01/24/19 1253)     Initial Impression / Assessment and Plan / ED Course  I have reviewed the triage vital signs and the nursing notes.  Pertinent labs & imaging results that were available during my care of the patient were reviewed by me and considered in my medical decision making (see chart for details).        Patient with abdominal pain.  Does radiate testicle.  Work-up reassuring.  CT scan reassuring with contrast.  Had recent noncontrast scan but had some hematuria.  However does have testicular pain.  Doubt torsion as he has good cremasteric reflex and good lie however infection still could be considered.  Ultrasound pending.  Care will be turned over to to oncoming provider.  Final Clinical Impressions(s) / ED Diagnoses   Final diagnoses:  Lower abdominal pain    ED Discharge Orders    None       Davonna Belling, MD 01/24/19 1501

## 2019-01-24 NOTE — ED Triage Notes (Signed)
Pt reports that he started having left flank pain around 8am today with nausea and constant feeling to urinate. Pt reports that he had kidney stone on 8/10, but after scans they thought he passed it.

## 2019-01-24 NOTE — ED Notes (Signed)
Pt given urinal and informed of need for urine specimen  

## 2019-01-24 NOTE — ED Notes (Signed)
Post residual bladder scan showed 53mL.

## 2019-01-24 NOTE — ED Notes (Signed)
Patient transported to CT 

## 2019-04-24 ENCOUNTER — Other Ambulatory Visit: Payer: Self-pay

## 2019-04-24 DIAGNOSIS — Z20822 Contact with and (suspected) exposure to covid-19: Secondary | ICD-10-CM

## 2019-04-25 ENCOUNTER — Telehealth: Payer: Self-pay | Admitting: Family Medicine

## 2019-04-25 LAB — NOVEL CORONAVIRUS, NAA: SARS-CoV-2, NAA: DETECTED — AB

## 2019-04-25 NOTE — Telephone Encounter (Signed)
Lovena Le calling from [facility] Department calling to request the patient number for the purpose of contact tracing. Lovena Le was assisted by the agent.

## 2019-05-27 ENCOUNTER — Other Ambulatory Visit: Payer: Self-pay | Admitting: Physician Assistant

## 2019-05-27 DIAGNOSIS — U071 COVID-19: Secondary | ICD-10-CM

## 2019-05-27 NOTE — Progress Notes (Signed)
cxr 

## 2019-05-28 ENCOUNTER — Other Ambulatory Visit: Payer: Self-pay

## 2019-05-28 ENCOUNTER — Ambulatory Visit (INDEPENDENT_AMBULATORY_CARE_PROVIDER_SITE_OTHER): Payer: 59

## 2019-05-28 DIAGNOSIS — U071 COVID-19: Secondary | ICD-10-CM

## 2019-05-29 ENCOUNTER — Other Ambulatory Visit: Payer: Self-pay | Admitting: Physician Assistant

## 2019-05-29 MED ORDER — AZITHROMYCIN 250 MG PO TABS: ORAL_TABLET | ORAL | 0 refills | Status: AC

## 2019-05-29 MED ORDER — ALBUTEROL SULFATE HFA 108 (90 BASE) MCG/ACT IN AERS: 2.0000 | INHALATION_SPRAY | Freq: Four times a day (QID) | RESPIRATORY_TRACT | 0 refills | Status: AC | PRN

## 2019-12-23 ENCOUNTER — Other Ambulatory Visit: Payer: Self-pay | Admitting: Family Medicine

## 2019-12-24 ENCOUNTER — Encounter: Payer: Self-pay | Admitting: *Deleted

## 2019-12-24 NOTE — Telephone Encounter (Signed)
Last OV 01/14/19-30 day supply given today  ntbs for further refills

## 2020-03-18 ENCOUNTER — Encounter: Payer: Self-pay | Admitting: Dermatology

## 2020-03-18 ENCOUNTER — Other Ambulatory Visit: Payer: Self-pay

## 2020-03-18 ENCOUNTER — Ambulatory Visit: Payer: PRIVATE HEALTH INSURANCE | Admitting: Dermatology

## 2020-03-18 DIAGNOSIS — Z1283 Encounter for screening for malignant neoplasm of skin: Secondary | ICD-10-CM | POA: Diagnosis not present

## 2020-03-18 DIAGNOSIS — L409 Psoriasis, unspecified: Secondary | ICD-10-CM | POA: Diagnosis not present

## 2020-06-17 ENCOUNTER — Ambulatory Visit: Payer: PRIVATE HEALTH INSURANCE | Admitting: Dermatology

## 2021-01-03 ENCOUNTER — Encounter: Payer: Self-pay | Admitting: Dermatology

## 2021-01-03 NOTE — Progress Notes (Signed)
   New Patient   Subjective  Dean Nielsen is a 25 y.o. male who presents for the following: Annual Exam (left side tender spot x 1.5 months never bleed, right lower leg dark spot no change x years).  Skin examination, history of melanoma Location:  Duration:  Quality:  Associated Signs/Symptoms: Modifying Factors:  Severity:  Timing: Context:    The following portions of the chart were reviewed this encounter and updated as appropriate:      Objective  Well appearing patient in no apparent distress; mood and affect are within normal limits. Mid Back Full body skin exam.  History of melanoma roughly 4 years ago.  No atypical pigmented lesions.  Left 3rd Finger Proximal Interphalangeal Joint, Left Elbow - Posterior, Right Dorsal Hand Localized scale and erythema.  It is possible minor trauma is responsible for this.    A full examination was performed including scalp, head, eyes, ears, nose, lips, neck, chest, axillae, abdomen, back, buttocks, bilateral upper extremities, bilateral lower extremities, hands, feet, fingers, toes, fingernails, and toenails. All findings within normal limits unless otherwise noted below.   Assessment & Plan  Screening for malignant neoplasm of skin Mid Back  Annual skin examination.  Encouraged to self examine twice annually.  Psoriasis Left Elbow - Posterior; Right Dorsal Hand; Left 3rd Finger Proximal Interphalangeal Joint  Mild psoriasis and if areas get worse we can send in prescription. Patient will call if he needs it.

## 2021-03-20 IMAGING — CT CT ABDOMEN AND PELVIS WITH CONTRAST
1 of 2 series · 14 of 32 positions shown, 19 images · IV contrast (omnipaque)
Comparison: January 18, 2019

CLINICAL DATA: Left flank pain and dysuria

EXAM:
CT ABDOMEN AND PELVIS WITH CONTRAST
TECHNIQUE: Multidetector CT imaging of the abdomen and pelvis was performed
using the standard protocol following bolus administration of
intravenous contrast.
CONTRAST:  100mL OMNIPAQUE IOHEXOL 300 MG/ML  SOLN

[Series 2: axial st · axial · 0.67mm/px · z∈[-486,-92]mm · 14 of 89 slices shown, 19 images]
[im 5/89  soft-tissue]
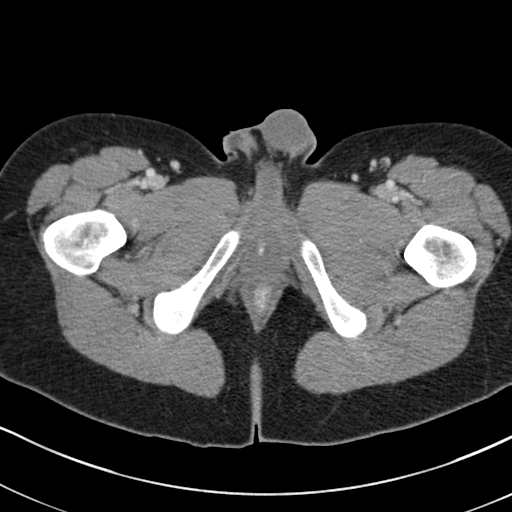
[im 5/89  bone]
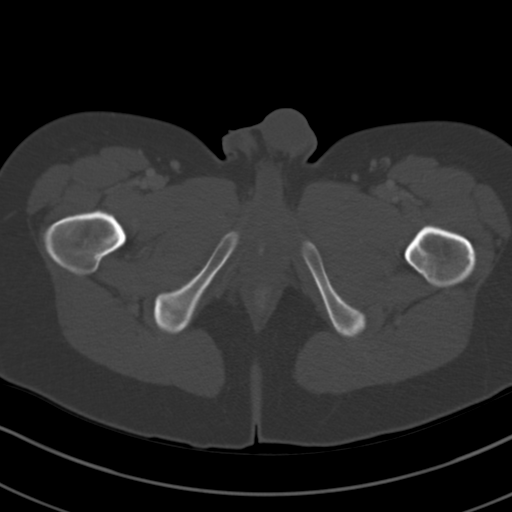
[im 10/89  soft-tissue]
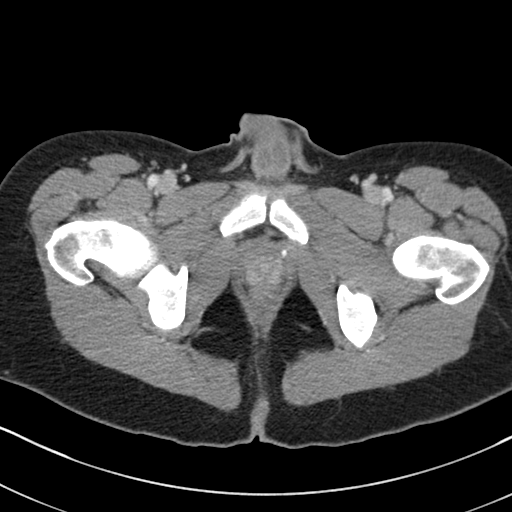
[im 20/89  soft-tissue]
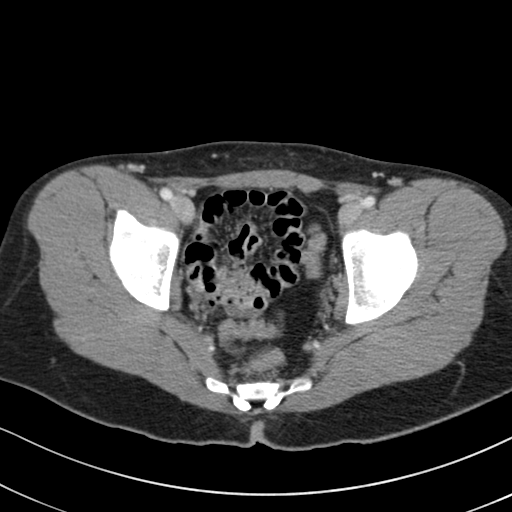
[im 25/89  soft-tissue]
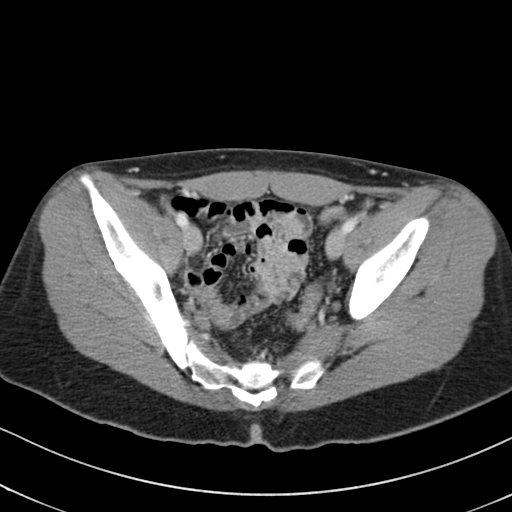
[im 30/89  soft-tissue]
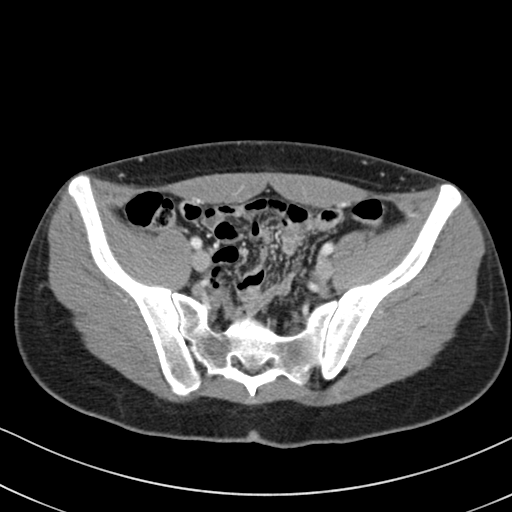
[im 40/89  soft-tissue]
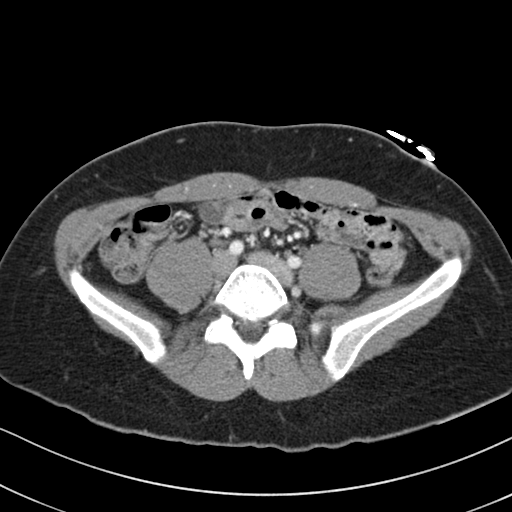
[im 45/89  soft-tissue]
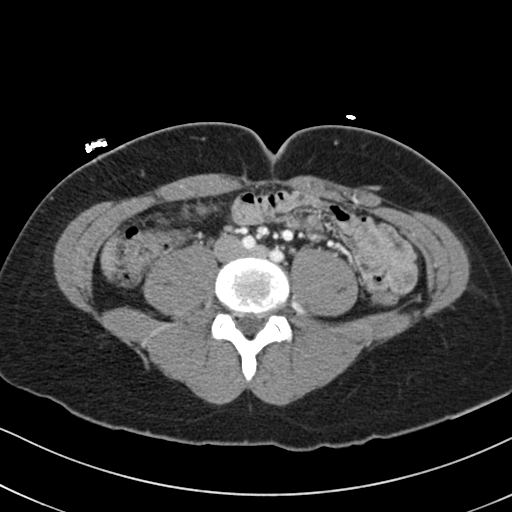
[im 49/89  soft-tissue]
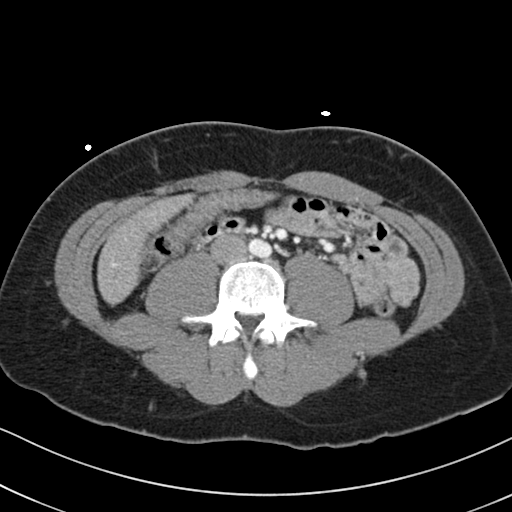
[im 59/89  soft-tissue]
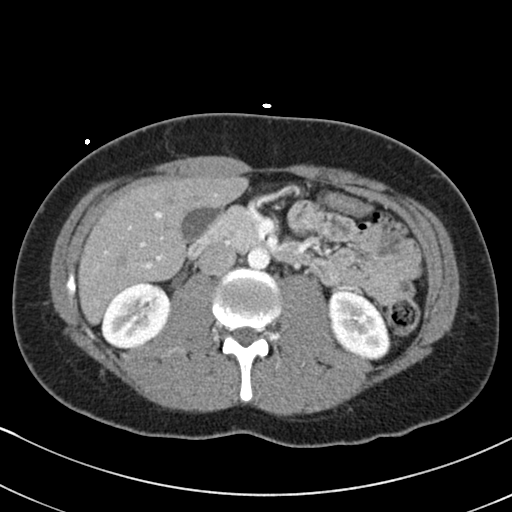
[im 59/89  bone]
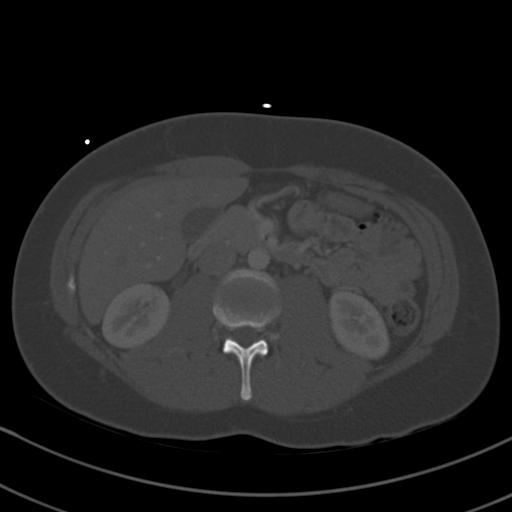
[im 64/89  soft-tissue]
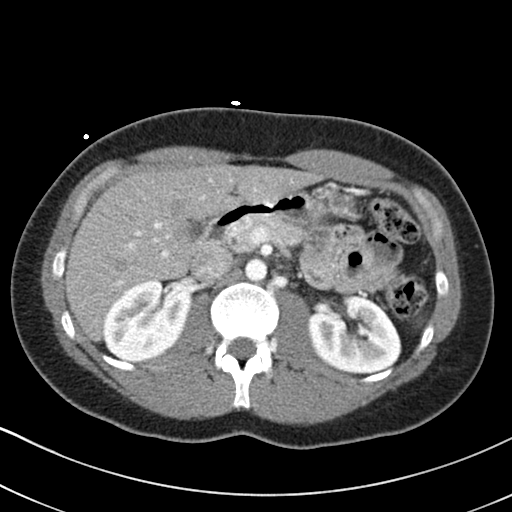
[im 69/89  soft-tissue]
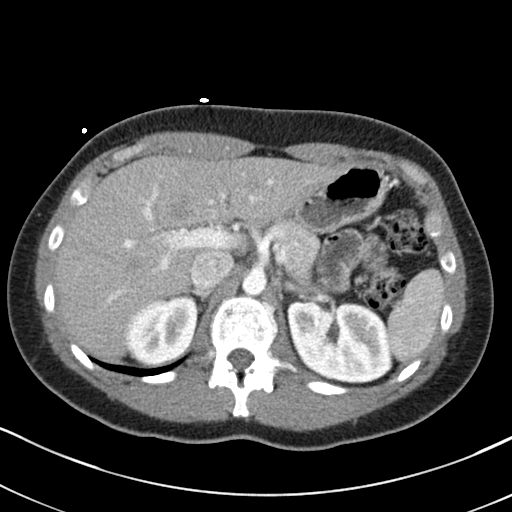
[im 69/89  lung]
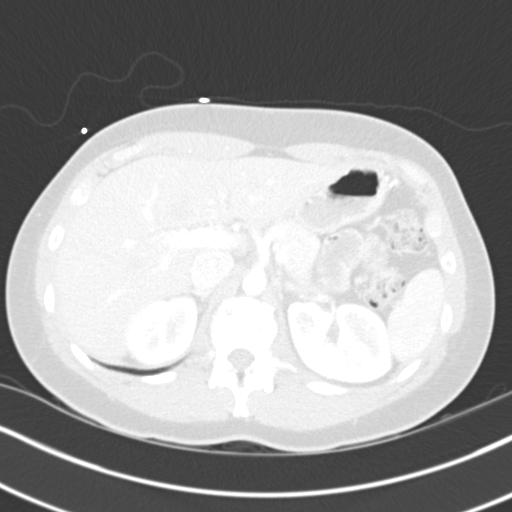
[im 74/89  lung]
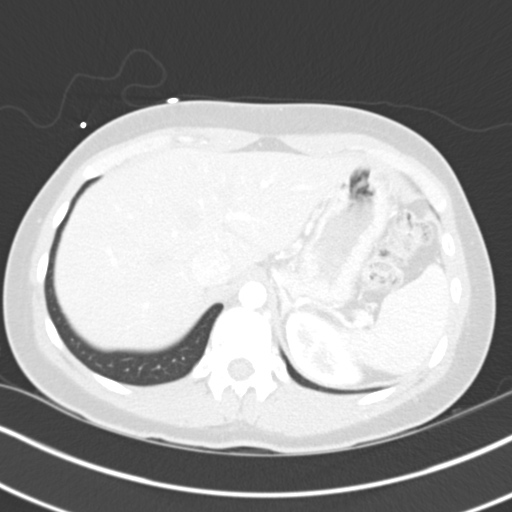
[im 79/89  soft-tissue]
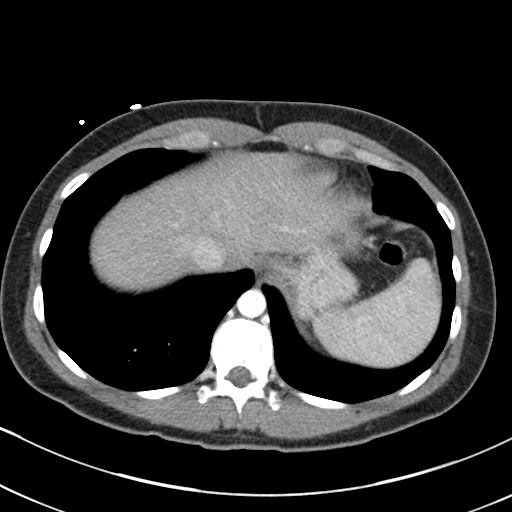
[im 79/89  lung]
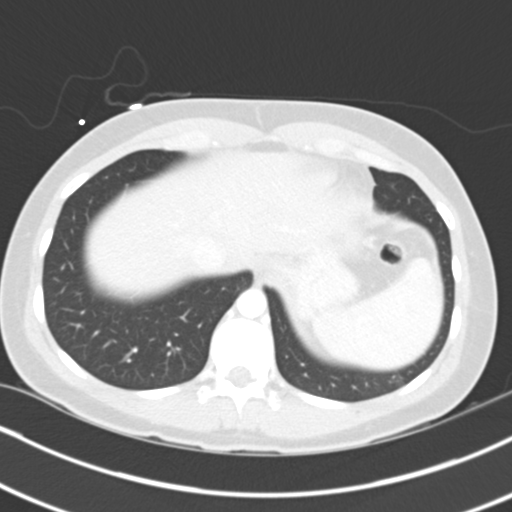
[im 84/89  soft-tissue]
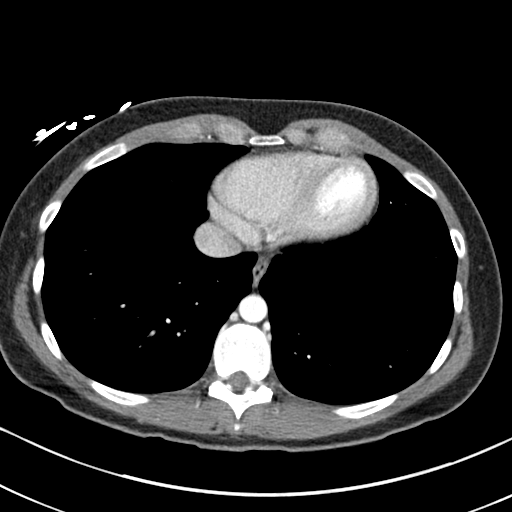
[im 84/89  lung]
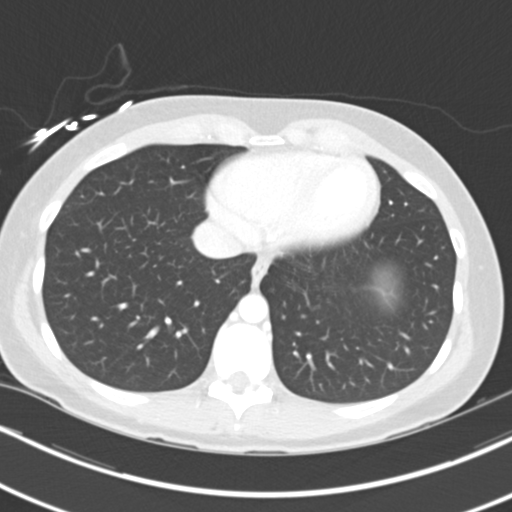

[14 of 32 positions shown; findings below may reference images not displayed]

FINDINGS: Lower chest: Lung bases are clear.

Hepatobiliary: There is a degree of hepatic steatosis. No focal
liver lesions are evident. Gallbladder wall is not appreciably
thickened. There is no biliary duct dilatation.

Pancreas: There is no pancreatic mass or inflammatory focus.

Spleen: No splenic lesions are evident.

Adrenals/Urinary Tract: Adrenals bilaterally appear unremarkable.
Kidneys bilaterally show no evident mass or hydronephrosis on either
side. There is no demonstrable renal or ureteral calculus on either
side. Urinary bladder is midline with wall thickness within normal
limits.

Stomach/Bowel: There is no appreciable bowel wall or mesenteric
thickening. No evident bowel obstruction. Terminal ileum appears
normal. There is no evident free air or portal venous air.

Vascular/Lymphatic: There is no abdominal aortic aneurysm. No
vascular lesions are evident. There is no evident adenopathy in the
abdomen or pelvis.

Reproductive: Prostate and seminal vesicles are normal in size and
contour. There is no evident pelvic mass.

Other: The appendix appears normal. There is no evident abscess or
ascites in the abdomen or pelvis.

Musculoskeletal: There are no blastic or lytic bone lesions. There
is a stable bone island in the right femoral neck. No intramuscular
or abdominal wall lesions are evident.
IMPRESSION: 1. A cause for patient's symptoms has not been established with this
study.

2. No evident renal or ureteral calculus. No hydronephrosis. Urinary
bladder wall thickness is within normal limits.

3. No evident bowel obstruction. No abscess in the abdomen or
pelvis. Appendix appears normal.

4.  Hepatic steatosis.

## 2021-03-22 ENCOUNTER — Ambulatory Visit: Payer: PRIVATE HEALTH INSURANCE | Admitting: Dermatology

## 2021-06-16 ENCOUNTER — Ambulatory Visit: Payer: PRIVATE HEALTH INSURANCE | Admitting: Dermatology

## 2021-07-22 IMAGING — DX DG CHEST 2V
2 series · 2 of 2 positions shown · non-contrast
Comparison: 05/09/2018

CLINICAL DATA: Prior history of COVID. Shortness of breath. Chest
pain.

EXAM:
CHEST - 2 VIEW

[chest pa]
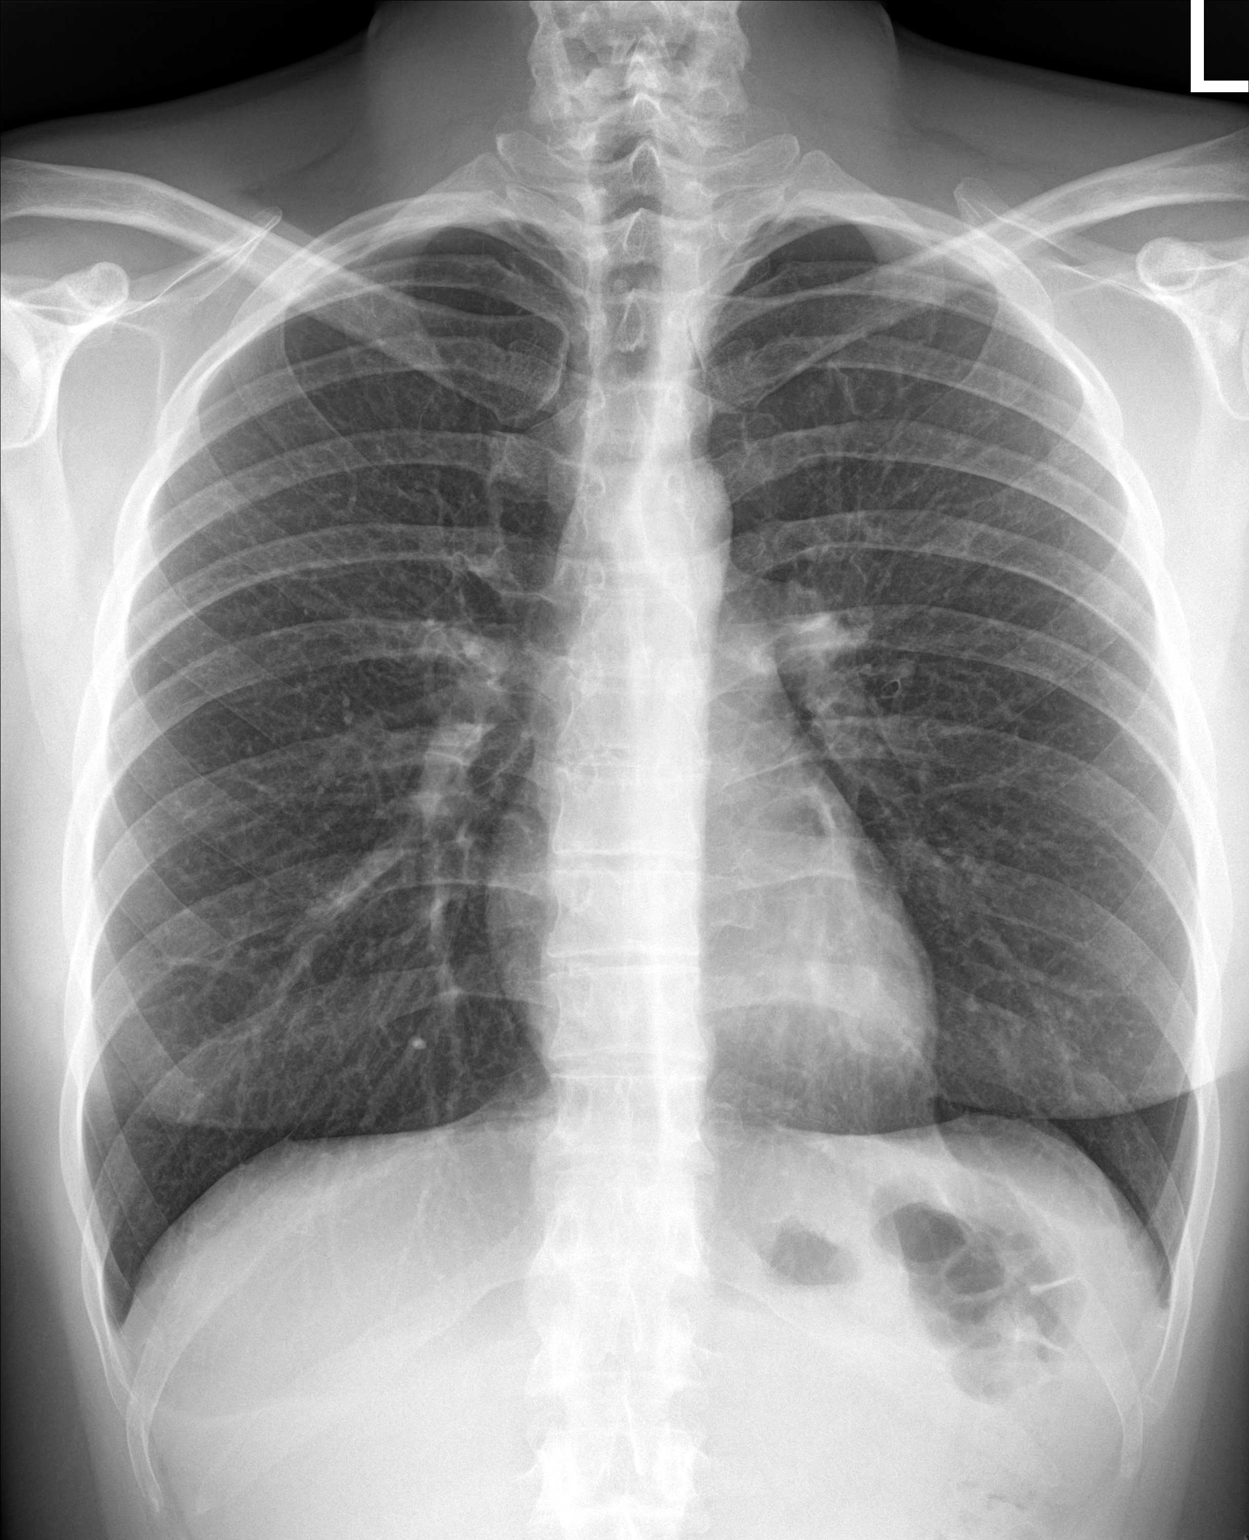

[chest lat]
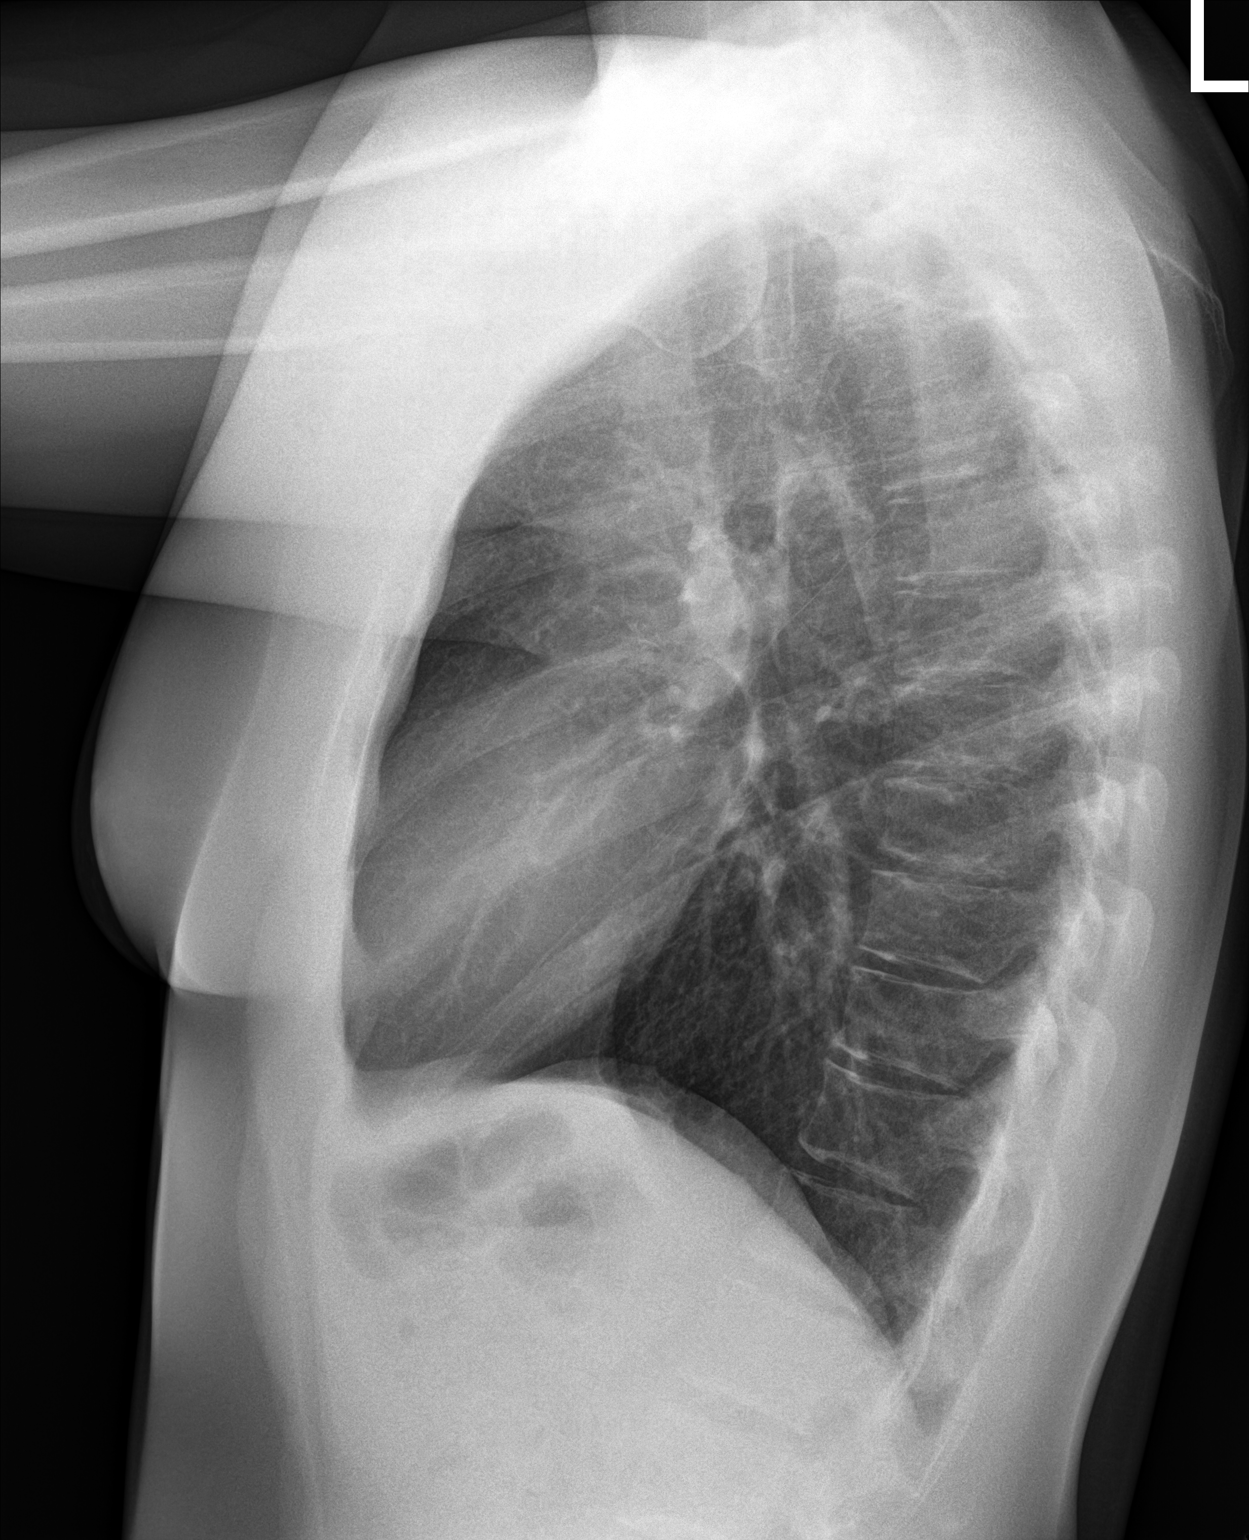

[2 of 2 positions shown; findings below may reference images not displayed]

FINDINGS: Mediastinum and hilar structures normal. Heart size normal. Lungs
are clear. No pleural effusion or pneumothorax. No acute bony
abnormality. Chest is stable 05/09/2018.
IMPRESSION: No acute abnormality.

## 2021-08-09 ENCOUNTER — Ambulatory Visit (INDEPENDENT_AMBULATORY_CARE_PROVIDER_SITE_OTHER): Payer: PRIVATE HEALTH INSURANCE | Admitting: Dermatology

## 2021-08-09 ENCOUNTER — Telehealth: Payer: Self-pay

## 2021-08-09 ENCOUNTER — Other Ambulatory Visit: Payer: Self-pay

## 2021-08-09 ENCOUNTER — Encounter: Payer: Self-pay | Admitting: Dermatology

## 2021-08-09 DIAGNOSIS — L2084 Intrinsic (allergic) eczema: Secondary | ICD-10-CM | POA: Diagnosis not present

## 2021-08-09 MED ORDER — TACROLIMUS 0.1 % EX OINT: TOPICAL_OINTMENT | Freq: Every day | CUTANEOUS | 0 refills | Status: AC

## 2021-08-09 NOTE — Telephone Encounter (Signed)
Patient aware with covermymeds they couldn't verify his insurance and a cash pay for the protopic would be best. ?

## 2021-08-09 NOTE — Progress Notes (Signed)
? ?  Follow-Up Visit ?  ?Subjective  ?Dean Nielsen is a 26 y.o. male who presents for the following: Eczema (Hands are really bad- using triamcinolone cream- wants to talk about dupixent). ? ?Incapacitating hand eczema ?Location:  ?Duration:  ?Quality:  ?Associated Signs/Symptoms: ?Modifying Factors:  ?Severity:  ?Timing: ?Context:  ? ?Objective  ?Well appearing patient in no apparent distress; mood and affect are within normal limits. ?Left Hand - Anterior, Right Hand - Anterior ?Persistent ongoing severe flare of eczema more on his palmar hands and fingers his left ear.  This is interfering with his ability to work or function or sleep.  He has tried a multitude of prescription and over-the-counter topicals with essentially no relief.  I discussed alternative topicals, topical retinoids (when approved for the hands only available in [place]) and light based therapy.  He really would be served best if we can get approval for Dupixent.  He understands that this is a potentially effective treatment but not a cure but is strongly in favor of trying to get this authorized.  ? Both Hands (having issues doing his job due to being so severe.  left great toe, left ear, plus previous back, chest, arms, legs.  ? ?Previous creams, clobetasol,2013, triamcinolone, otc hydrocortisone, Vasolin, halobetasol ointment 2013, 2015, cernivo 2017,  ? ?Patch testing done by Dr. [place] Crane (2013) ? ? ? ? ? ? ? ? ?A focused examination was performed including head, neck, ears, hands, scalp.. Relevant physical exam findings are noted in the Assessment and Plan. ? ? ?Assessment & Plan  ? ? ?Intrinsic eczema ?Left Hand - Anterior; Right Hand - Anterior ? ?We will try to get approval for Dupixent. ? ?tacrolimus (PROTOPIC) 0.1 % ointment - Left Hand - Anterior, Right Hand - Anterior ?Apply topically at bedtime. ? ? ? ? ? ?I, Lavonna Monarch, MD, have reviewed all documentation for this visit.  The documentation on 08/09/21 for the exam, diagnosis,  procedures, and orders are all accurate and complete. ?

## 2021-08-10 NOTE — Telephone Encounter (Signed)
Called [facility] in [place] and gave them the goodrx number off card- tacrolimus ointment $56.06 at pharmacy waiting on patient to pick up. Left message with this information on patient voicemail. Told to call us if needed.  ?

## 2021-08-16 ENCOUNTER — Telehealth: Payer: Self-pay | Admitting: Dermatology

## 2021-08-16 MED ORDER — DUPIXENT 300 MG/2ML ~~LOC~~ SOAJ: 600.0000 mg | Freq: Once | SUBCUTANEOUS | 0 refills | Status: AC

## 2021-08-16 MED ORDER — DUPIXENT 300 MG/2ML ~~LOC~~ SOAJ: 300.0000 mg | SUBCUTANEOUS | 4 refills | Status: DC

## 2021-08-16 NOTE — Telephone Encounter (Signed)
Patient is calling to check the status of his prescription for Dupixent.  Patient wants to know if it has been approved yet. ?

## 2021-08-16 NOTE — Addendum Note (Signed)
Addended by: Sheran Lawless on: 08/16/2021 09:08 AM ? ? Modules accepted: Orders ? ?

## 2021-08-16 NOTE — Telephone Encounter (Signed)
Fax received from Dean Nielsen stating that the patient's Dupixent is approved from 07/13/2021 through 12/10/2021. Also per [name] the patient's medication has to be sent to [place].  ?

## 2021-08-17 NOTE — Telephone Encounter (Signed)
Phone call to patient to let him know that is Dupixent is approved. Patient is going to call [facility] to schedule his shipment. Patient will call with any problems or concerns.  ?

## 2021-08-18 ENCOUNTER — Telehealth: Payer: Self-pay | Admitting: *Deleted

## 2021-08-18 DIAGNOSIS — L2084 Intrinsic (allergic) eczema: Secondary | ICD-10-CM

## 2021-08-18 MED ORDER — DUPIXENT 300 MG/2ML ~~LOC~~ SOAJ: 300.0000 mg | SUBCUTANEOUS | 8 refills | Status: AC

## 2021-08-18 MED ORDER — DUPIXENT 300 MG/2ML ~~LOC~~ SOAJ: 600.0000 mg | Freq: Once | SUBCUTANEOUS | 0 refills | Status: AC

## 2021-08-18 NOTE — Telephone Encounter (Signed)
Received letter from [place]  approved but patient pharmacy benefits restrict him for with [facility]. Patient must use [facility]. Some copays will be activated and other copay cards will require for patinets to either go online or call to activate the copay card.  ?

## 2021-08-23 ENCOUNTER — Telehealth: Payer: Self-pay

## 2021-08-23 NOTE — Telephone Encounter (Signed)
Fax received from [facility] stating that they need clarification on the patient's Dupixent prescription. Prescription escribed.  ?

## 2021-08-30 ENCOUNTER — Other Ambulatory Visit: Payer: Self-pay | Admitting: *Deleted

## 2021-12-20 ENCOUNTER — Other Ambulatory Visit: Payer: Self-pay | Admitting: Internal Medicine

## 2021-12-20 ENCOUNTER — Ambulatory Visit
Admission: RE | Admit: 2021-12-20 | Discharge: 2021-12-20 | Disposition: A | Discharge status: Home / Self Care | Payer: PRIVATE HEALTH INSURANCE | Source: Ambulatory Visit | Attending: Internal Medicine | Admitting: Internal Medicine

## 2021-12-20 DIAGNOSIS — R058 Other specified cough: Secondary | ICD-10-CM

## 2022-02-09 ENCOUNTER — Ambulatory Visit: Payer: PRIVATE HEALTH INSURANCE | Admitting: Dermatology
